# Patient Record
Sex: Male | Born: 1970 | Race: Black or African American | Hispanic: No | Marital: Single | Smoking: Current some day smoker
Health system: Southern US, Community
[De-identification: ages and names within clinical notes are randomized; demographics above are authoritative.]

## PROBLEM LIST (undated history)

## (undated) DIAGNOSIS — E119 Type 2 diabetes mellitus without complications: Secondary | ICD-10-CM

---

## 2018-06-17 ENCOUNTER — Other Ambulatory Visit: Payer: Self-pay

## 2018-06-17 ENCOUNTER — Encounter (HOSPITAL_COMMUNITY): Payer: Self-pay

## 2018-06-17 ENCOUNTER — Ambulatory Visit (HOSPITAL_COMMUNITY)
Admission: EM | Admit: 2018-06-17 | Discharge: 2018-06-17 | Disposition: A | Discharge status: Home / Self Care | Payer: Self-pay | Attending: Internal Medicine | Admitting: Internal Medicine

## 2018-06-17 DIAGNOSIS — K0889 Other specified disorders of teeth and supporting structures: Secondary | ICD-10-CM

## 2018-06-17 HISTORY — DX: Type 2 diabetes mellitus without complications: E11.9

## 2018-06-17 MED ORDER — AMOXICILLIN-POT CLAVULANATE 875-125 MG PO TABS: 1.0000 | ORAL_TABLET | Freq: Two times a day (BID) | ORAL | 0 refills | Status: AC

## 2018-06-17 MED ORDER — HYDROCODONE-ACETAMINOPHEN 5-325 MG PO TABS
1.0000 | ORAL_TABLET | Freq: Once | ORAL | Status: AC
  Administered 2018-06-17: 10:00:00 1 via ORAL

## 2018-06-17 MED ORDER — HYDROCODONE-ACETAMINOPHEN 5-325 MG PO TABS
ORAL_TABLET | ORAL | Status: AC
  Filled 2018-06-17: qty 1

## 2018-06-17 NOTE — ED Triage Notes (Signed)
Pt state he has a toothache. Pt the pain on the left side of his mouth.

## 2018-06-17 NOTE — ED Provider Notes (Signed)
[facility]    CSN: [id number] Arrival date & time: 06/17/18  [id number]     History   Chief Complaint Chief Complaint  Patient presents with  . Dental Pain    HPI Patrick Cisneros is a 48 y.o. male.   [name] with complaints of left lower dental pain which has been intermittent for years now but worse over the past week and now constant. Pain with eating. No facial swelling. Last week took some one else's left over penicillin which helped for a day. Has been taking aleve which helps only temporarily. No fevers. Doesn't have a local dentist. No known drainage. No ear pain or sore throat. No tongue or jaw pain. Hx of dm.    ROS per HPI, negative if not otherwise mentioned.      Past Medical History:  Diagnosis Date  . Diabetes mellitus without complication (HCC)     There are no active problems to display for this patient.   History reviewed. No pertinent surgical history.     Home Medications    Prior to Admission medications   Medication Sig Start Date End Date Taking? Authorizing Provider  insulin glargine (LANTUS) 100 UNIT/ML injection Inject into the skin daily.   Yes [provider]  amoxicillin-clavulanate (AUGMENTIN) 875-125 MG tablet Take 1 tablet by mouth every 12 (twelve) hours for 7 days. 06/17/18 06/24/18  Georgetta HaberBurky,  B, NP    Family History History reviewed. No pertinent family history.  Social History Social History   Tobacco Use  . Smoking status: Not on file  Substance Use Topics  . Alcohol use: Not on file  . Drug use: Not on file     Allergies   Patient has no known allergies.   Review of Systems Review of Systems   Physical Exam Triage Vital Signs ED Triage Vitals  Enc Vitals Group     BP 06/17/18 0923 (!) 144/94     Pulse Rate 06/17/18 0923 82     Resp 06/17/18 0923 16     Temp 06/17/18 0923 98.9 F (37.2 C)     Temp Source 06/17/18 0923 Oral     SpO2 06/17/18 0923 97 %     Weight  06/17/18 0924 195 lb (88.5 kg)     Height --      Head Circumference --      Peak Flow --      Pain Score 06/17/18 0924 10     Pain Loc --      Pain Edu? --      Excl. in GC? --    No data found.  Updated Vital Signs BP (!) 144/94 (BP Location: Left Arm) Comment: Reported BP to CMA Theresa Staley  Pulse 82   Temp 98.9 F (37.2 C) (Oral)   Resp 16   Wt 195 lb (88.5 kg)   SpO2 97%    Physical Exam Constitutional:      Appearance: He is well-developed.  HENT:     Mouth/Throat:     Dentition: Abnormal dentition. Dental caries present.     Comments: Tooth #19 broken off and with tenderness to jaw line underlying; multiple cares noted throughout Cardiovascular:     Rate and Rhythm: Normal rate and regular rhythm.  Pulmonary:     Effort: Pulmonary effort is normal.     Breath sounds: Normal breath sounds.  Skin:    General: Skin is warm and dry.  Neurological:     Mental  Status: He is alert and oriented to person, place, and time.      UC Treatments / Results  Labs (all labs ordered are listed, but only abnormal results are displayed) Labs Reviewed - No data to display  EKG None  Radiology No results found.  Procedures Procedures (including critical care time)  Medications Ordered in UC Medications  HYDROcodone-acetaminophen (NORCO/VICODIN) 5-325 MG per tablet 1 tablet (1 tablet Oral Given 06/17/18 1002)    Initial Impression / Assessment and Plan / UC Course  I have reviewed the triage vital signs and the nursing notes.  Pertinent labs & imaging results that were available during my care of the patient were reviewed by me and considered in my medical decision making (see chart for details).     Course of augmentin provided. Ice, nsaids/tylenol for pain. Patient was dropped off here today, norco given before departure. Encouraged to follow up with dentist for definitive treatment. Patient verbalized understanding and agreeable to plan.   Final Clinical  Impressions(s) / UC Diagnoses   Final diagnoses:  Pain, dental     Discharge Instructions     Complete course of antibiotics.  May use aleve as well as tylenol as needed for pain. Ice application as needed.  Please follow up with a dentist for definitive treatment.     ED Prescriptions    Medication Sig Dispense Auth. Provider   amoxicillin-clavulanate (AUGMENTIN) 875-125 MG tablet Take 1 tablet by mouth every 12 (twelve) hours for 7 days. 14 tablet Georgetta Haber, NP     Controlled Substance Prescriptions [place] Controlled Substance Registry consulted? Not Applicable   Georgetta Haber, NP 06/17/18 1017

## 2018-06-17 NOTE — Discharge Instructions (Signed)
Complete course of antibiotics.  May use aleve as well as tylenol as needed for pain. Ice application as needed.  Please follow up with a dentist for definitive treatment.

## 2019-06-08 ENCOUNTER — Other Ambulatory Visit: Payer: Self-pay

## 2019-06-08 ENCOUNTER — Encounter (HOSPITAL_COMMUNITY): Payer: Self-pay

## 2019-06-08 ENCOUNTER — Emergency Department (HOSPITAL_COMMUNITY)
Admission: EM | Admit: 2019-06-08 | Discharge: 2019-06-09 | Disposition: A | Discharge status: Home / Self Care | Payer: Medicare HMO | Attending: Emergency Medicine | Admitting: Emergency Medicine

## 2019-06-08 DIAGNOSIS — M79604 Pain in right leg: Secondary | ICD-10-CM | POA: Diagnosis present

## 2019-06-08 DIAGNOSIS — M159 Polyosteoarthritis, unspecified: Secondary | ICD-10-CM | POA: Diagnosis not present

## 2019-06-08 DIAGNOSIS — E119 Type 2 diabetes mellitus without complications: Secondary | ICD-10-CM | POA: Diagnosis not present

## 2019-06-08 DIAGNOSIS — Z794 Long term (current) use of insulin: Secondary | ICD-10-CM | POA: Insufficient documentation

## 2019-06-08 NOTE — ED Triage Notes (Signed)
Pt arrives to ED w/ c/o RLE pain. Pt reports 10/10 pain.

## 2019-06-08 NOTE — ED Notes (Signed)
Pt is sitting outside 

## 2019-06-09 LAB — CBG MONITORING, ED: Glucose-Capillary: 262 mg/dL — ABNORMAL HIGH (ref 70–99)

## 2019-06-09 MED ORDER — IBUPROFEN 400 MG PO TABS
600.0000 mg | ORAL_TABLET | Freq: Once | ORAL | Status: DC
  Filled 2019-06-09: qty 1

## 2019-06-09 MED ORDER — ACETAMINOPHEN 325 MG PO TABS
650.0000 mg | ORAL_TABLET | Freq: Once | ORAL | Status: AC
  Administered 2019-06-09: 650 mg via ORAL
  Filled 2019-06-09: qty 2

## 2019-06-09 NOTE — Discharge Instructions (Signed)
Thank you for allowing me to care for you today in the Emergency Department.   Call the number on page 11 of your discharge paperwork to get established with a new primary care provider that takes your insurance.  Call to schedule follow-up appointment with Dr. Aundria Rud regarding the symptoms that brought you to the emergency department today.  Take 650 mg of Tylenol once every 6 hours.  Do not take more than 4000 mg of Tylenol in a 24-hour period.  Start to stretch the muscles of your hip and knee as your pain allows.  You can also try several over-the-counter treatments such as lidocaine patches and muscle cream.  Take your home dose of Lantus when you at home.  Return to the emergency department if you have any fall or injury, if you become unable to walk, if you develop weakness or symptoms in your left leg or arms, slurred speech, persistent dizziness, or other new, concerning symptoms.

## 2019-06-09 NOTE — ED Notes (Signed)
Patient verbalizes understanding of discharge instructions. Opportunity for questioning and answers were provided. Armband removed by staff, pt discharged from ED ambulatory by self\  

## 2019-06-09 NOTE — ED Provider Notes (Signed)
[facility] EMERGENCY DEPARTMENT Provider Note   CSN: [id number] Arrival date & time: 06/08/19  2041     History Chief Complaint  Patient presents with  . Leg Pain    Patrick Cisneros is a 49 y.o. male with a history of diabetes mellitus who presents the emergency department with a chief complaint of right thigh numbness and right leg pain.  The patient reports that he works in [employer].  For the last few months, he has had intermittent numbness in the posterior right thigh and intermittent right upper leg pain.  No right lower leg numbness.  He reports that sometimes he will be at work and he will squat down and will feel like "his leg doesn't want him to stand back up" states that sometimes he will be walking and will feel like his leg may want to give out, but he has never fallen.  Symptoms are more prevalent when he is at work.  He also notices cracking and crunching in his right knee with walking.  Although he has been having symptoms for months, he noticed the pain in his right upper leg was more intense.  No new or worsening symptoms today.  He denies headache, neck pain, back pain, left leg pain, numbness, or weakness, urinary or fecal incontinence, fever, chills, recent falls or injuries, right calf pain, right hip pain, right ankle pain, no swelling or redness to the leg, or muscle cramps.  He is a diabetic.  He takes Lantus, but forgot to take his daily dose today because he was not certain what was going on with his leg.  He has not established with a primary care provider.  He has not been having polyuria or polydipsia.  Endorses cannabis use prior to coming to the ER.  No treatment for symptoms prior to arrival.  He reports that he had some type of surgery on his neck many years ago.  He was taking gabapentin for some time, but has not taken the medication recently because it made him too drowsy.  The history is provided by the patient. No language  interpreter was used.       Past Medical History:  Diagnosis Date  . Diabetes mellitus without complication (HCC)     There are no problems to display for this patient.   History reviewed. No pertinent surgical history.     No family history on file.  Social History   Tobacco Use  . Smoking status: Not on file  Substance Use Topics  . Alcohol use: Not on file  . Drug use: Not on file    Home Medications Prior to Admission medications   Medication Sig Start Date End Date Taking? Authorizing Provider  insulin glargine (LANTUS) 100 UNIT/ML injection Inject into the skin daily.    [provider]    Allergies    Patient has no known allergies.  Review of Systems   Review of Systems  Constitutional: Negative for appetite change, chills and fever.  Respiratory: Negative for shortness of breath and wheezing.   Cardiovascular: Negative for chest pain and palpitations.  Gastrointestinal: Negative for abdominal pain, diarrhea, nausea and vomiting.  Genitourinary: Negative for dysuria.  Musculoskeletal: Positive for arthralgias and myalgias. Negative for back pain, gait problem, neck pain and neck stiffness.  Skin: Negative for color change, pallor, rash and wound.  Allergic/Immunologic: Negative for immunocompromised state.  Neurological: Negative for dizziness, seizures, syncope, weakness, numbness and headaches.  Psychiatric/Behavioral: Negative for confusion.  Physical Exam Updated Vital Signs BP 127/89 (BP Location: Right Arm)   Pulse 85   Temp 98.5 F (36.9 C) (Oral)   Resp 18   SpO2 99%   Physical Exam Vitals and nursing note reviewed.  Constitutional:      Appearance: He is well-developed.     Comments: Strong odor of cannabis in the room  HENT:     Head: Normocephalic.  Eyes:     Conjunctiva/sclera: Conjunctivae normal.  Cardiovascular:     Rate and Rhythm: Normal rate and regular rhythm.     Heart sounds: No murmur.  Pulmonary:      Effort: Pulmonary effort is normal.  Abdominal:     General: There is no distension.     Palpations: Abdomen is soft.  Musculoskeletal:     Cervical back: Neck supple.     Right lower leg: No edema.     Left lower leg: No edema.     Comments: Tenderness to palpation to the lateral joint line of the right knee.  No medial joint line tenderness.  Negative anterior and posterior drawer test.  Negative valgus and varus stress test.  Full active and passive range of motion of the right knee.  Crepitus noted to the patella with range of motion.  Full active and passive range of motion of the right hip and ankle.  Right ankle is nontender.  No focal tenderness to the right hip.  No tenderness to the cervical, thoracic, or lumbar spinous processes or bilateral paraspinal muscles.  No tenderness over the bilateral SI joints.  Sensation is intact and equal to sharp and light touch throughout the bilateral lower extremities.  Good capillary refill.  Independently moves all digits of the bilateral feet.  Patient reports reproducible symptoms with hip in flexion and minimally externally rotated.  There is minimally decreased strength against resistance on the right as compared to the left in this position.  No shortening of the bilateral lower extremities.   Skin:    General: Skin is warm and dry.  Neurological:     Mental Status: He is alert.     Comments: Ambulates with a steady gait.  No ataxia.  Gait is not antalgic.  5-5 strength against resistance of the bilateral upper extremities with intact sensation.  Cranial nerves II through XII are grossly intact.  Psychiatric:        Behavior: Behavior normal.     ED Results / Procedures / Treatments   Labs (all labs ordered are listed, but only abnormal results are displayed) Labs Reviewed  CBG MONITORING, ED - Abnormal; Notable for the following components:      Result Value   Glucose-Capillary 262 (*)    All other components within normal limits     EKG None  Radiology No results found.  Procedures Procedures (including critical care time)  Medications Ordered in ED Medications  ibuprofen (ADVIL) tablet 600 mg (600 mg Oral Refused 06/09/19 0028)  acetaminophen (TYLENOL) tablet 650 mg (650 mg Oral Given 06/09/19 0026)    ED Course  I have reviewed the triage vital signs and the nursing notes.  Pertinent labs & imaging results that were available during my care of the patient were reviewed by me and considered in my medical decision making (see chart for details).    MDM Rules/Calculators/A&P                      49 year old male with a history of diabetes  mellitus presenting with chronic numbness of the right posterior thigh and intermittent right thigh pain.  Patient has not taken his home Lantus today.  Will check point-of-care CBG given complaints of numbness.  However, I strongly suspect that this is not related to electrolyte derangements or diabetes mellitus given the chronicity and intact sensation exam.  Negative straight leg raise.  No tenderness to the lumbar spinous processes or bilateral paraspinal muscles.  No tenderness over the bilateral SI joints.  Negative straight leg raise.  Lower suspicion for lumbar radiculopathy.  Symptoms are reproducible when the right hip is in flexion and minimally externally rotated when patient is performing strength against resistance.  Otherwise, no weakness.  Given his occupation, I suspect that he has osteoarthritis of the right hip and right knee.  However, since symptoms are chronic, will defer further work-up and evaluation in the ER.  We will start the patient on Tylenol and provide a referral to sports medicine.  He also needs to be reestablished with a new primary care provider.  CBG 262.  Low suspicion for HHS or DKA at this time.  Encourage the patient to take his home Lantus.  Referral for primary care given as well as an orthopedics referral for his chronic pain.  He is  hemodynamically stable and in no acute distress.  Safe for discharge to home with outpatient follow-up as indicated.  Final Clinical Impression(s) / ED Diagnoses Final diagnoses:  Osteoarthritis of multiple joints, unspecified osteoarthritis type    Rx / DC Orders ED Discharge Orders    None       McDonald, Mia A, PA-C 06/09/19 0147    Ward, Delice Bison, DO 06/09/19 5427

## 2019-11-29 ENCOUNTER — Other Ambulatory Visit: Payer: Self-pay

## 2019-11-29 ENCOUNTER — Emergency Department (HOSPITAL_COMMUNITY)
Admission: EM | Admit: 2019-11-29 | Discharge: 2019-11-29 | Disposition: A | Discharge status: Home / Self Care | Payer: Medicare Other | Attending: Emergency Medicine | Admitting: Emergency Medicine

## 2019-11-29 ENCOUNTER — Encounter (HOSPITAL_COMMUNITY): Payer: Self-pay | Admitting: Pediatrics

## 2019-11-29 DIAGNOSIS — Z5321 Procedure and treatment not carried out due to patient leaving prior to being seen by health care provider: Secondary | ICD-10-CM | POA: Insufficient documentation

## 2019-11-29 DIAGNOSIS — R739 Hyperglycemia, unspecified: Secondary | ICD-10-CM | POA: Insufficient documentation

## 2019-11-29 LAB — BASIC METABOLIC PANEL
Anion gap: 10 (ref 5–15)
BUN: 10 mg/dL (ref 6–20)
CO2: 25 mmol/L (ref 22–32)
Calcium: 9.4 mg/dL (ref 8.9–10.3)
Chloride: 99 mmol/L (ref 98–111)
Creatinine, Ser: 0.86 mg/dL (ref 0.61–1.24)
GFR, Estimated: >60 mL/min (ref >60)
Glucose, Bld: 347 mg/dL — ABNORMAL HIGH (ref 70–99)
Potassium: 4.1 mmol/L (ref 3.5–5.1)
Sodium: 134 mmol/L — ABNORMAL LOW (ref 135–145)

## 2019-11-29 LAB — CBC
HCT: 42.9 % (ref 39.0–52.0)
Hemoglobin: 14.3 g/dL (ref 13.0–17.0)
MCH: 31.7 pg (ref 26.0–34.0)
MCHC: 33.3 g/dL (ref 30.0–36.0)
MCV: 95.1 fL (ref 80.0–100.0)
Platelets: 325 10*3/uL (ref 150–400)
RBC: 4.51 MIL/uL (ref 4.22–5.81)
RDW: 13.2 % (ref 11.5–15.5)
WBC: 6.8 10*3/uL (ref 4.0–10.5)
nRBC: 0 % (ref 0.0–0.2)

## 2019-11-29 LAB — CBG MONITORING, ED: Glucose-Capillary: 325 mg/dL — ABNORMAL HIGH (ref 70–99)

## 2019-11-29 NOTE — ED Notes (Signed)
No call for vsx3 

## 2019-11-29 NOTE — ED Notes (Signed)
This patient is called multiple times for repeat vitals and registration. No answer.

## 2019-11-29 NOTE — ED Notes (Signed)
No call for VS x2 

## 2019-11-29 NOTE — ED Triage Notes (Signed)
Stated he has not checked his sugar in a few days and feels that his sugar is high.

## 2020-01-26 ENCOUNTER — Other Ambulatory Visit: Payer: Self-pay

## 2020-01-26 ENCOUNTER — Emergency Department (HOSPITAL_COMMUNITY): Payer: Medicare Other

## 2020-01-26 ENCOUNTER — Encounter (HOSPITAL_COMMUNITY): Payer: Self-pay

## 2020-01-26 DIAGNOSIS — E119 Type 2 diabetes mellitus without complications: Secondary | ICD-10-CM | POA: Insufficient documentation

## 2020-01-26 DIAGNOSIS — Z794 Long term (current) use of insulin: Secondary | ICD-10-CM | POA: Insufficient documentation

## 2020-01-26 DIAGNOSIS — S92252A Displaced fracture of navicular [scaphoid] of left foot, initial encounter for closed fracture: Secondary | ICD-10-CM | POA: Insufficient documentation

## 2020-01-26 DIAGNOSIS — S99922A Unspecified injury of left foot, initial encounter: Secondary | ICD-10-CM | POA: Diagnosis present

## 2020-01-26 DIAGNOSIS — Y92410 Unspecified street and highway as the place of occurrence of the external cause: Secondary | ICD-10-CM | POA: Insufficient documentation
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
## 2020-01-27 ENCOUNTER — Telehealth (HOSPITAL_COMMUNITY): Payer: Self-pay | Admitting: Emergency Medicine

## 2020-01-27 ENCOUNTER — Emergency Department (HOSPITAL_COMMUNITY)
Admission: EM | Admit: 2020-01-27 | Discharge: 2020-01-27 | Disposition: A | Discharge status: Home / Self Care | Payer: Medicare Other | Attending: Emergency Medicine | Admitting: Emergency Medicine

## 2020-01-27 DIAGNOSIS — S92252A Displaced fracture of navicular [scaphoid] of left foot, initial encounter for closed fracture: Secondary | ICD-10-CM

## 2020-01-27 MED ORDER — KETOROLAC TROMETHAMINE 60 MG/2ML IM SOLN
30.0000 mg | Freq: Once | INTRAMUSCULAR | Status: AC
  Administered 2020-01-27: 30 mg via INTRAMUSCULAR
  Filled 2020-01-27: qty 2

## 2020-01-27 MED ORDER — KETOROLAC TROMETHAMINE 60 MG/2ML IM SOLN: 30.0000 mg | Freq: Once | INTRAMUSCULAR | 0 refills | Status: AC

## 2020-01-27 MED ORDER — HYDROCODONE-ACETAMINOPHEN 5-325 MG PO TABS: 1.0000 | ORAL_TABLET | Freq: Four times a day (QID) | ORAL | 0 refills | Status: DC | PRN

## 2020-01-27 NOTE — Progress Notes (Signed)
Orthopedic Tech Progress Note Patient Details:  Patrick Cisneros [facility] May 28, 1970 [id number]  Ortho Devices Type of Ortho Device: Short leg splint Ortho Device/Splint Location: LLE Ortho Device/Splint Interventions: Application   Post Interventions Patient Tolerated: Well Instructions Provided: Care of device   Patrick Cisneros [name] 01/27/2020, 3:01 AM

## 2020-01-27 NOTE — Discharge Instructions (Signed)
Thank you for allowing me to care for you today in the Emergency Department.   Please call tomorrow to schedule a follow-up appointment with orthopedic surgery.  Their information is listed above.  Typically, they will like to see about 1 week after an injury.  However, since it has been almost 2 weeks, they should be able to schedule you anytime.  Use the crutches as you should not bear weight on your left foot until you have been cleared by orthopedic surgery.  Keep the splint clean and dry.  Cover it with a plastic bag if you need to bathe or shower.  Take 650 mg of Tylenol or 600 mg of ibuprofen with food every 6 hours for pain.  You can alternate between these 2 medications every 3 hours if your pain returns.  For instance, you can take Tylenol at noon, followed by a dose of ibuprofen at 3, followed by second dose of Tylenol and 6.  Elevate the leg so that your toes are at or above the level of your nose when you are sitting and resting.  Return to the emergency department if you have any fall or injury, if your toes turn blue, if you have severe swelling to the left lower leg, or other new, concerning injuries.
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
## 2020-01-27 NOTE — ED Notes (Signed)
Ortho at bedside.

## 2020-01-27 NOTE — ED Provider Notes (Signed)
Patient was seen and evaluated by my partner, Frederik Pear, PA earlier this morning for previous foot injury.  He was found to have a navicular fracture of the left foot.  Patient was discharged home with a prescription for Toradol IM injection.  [facility] [place] faxed a paper indicating that they do not have this medication available.  Since patient does have a fracture, I will prescribe opiate pain medication instead.   Fayrene Helper, PA-C 01/27/20 0945    Tegeler, Canary Brim, MD 01/27/20 [phone]

## 2020-01-27 NOTE — ED Notes (Signed)
Patient provided juice, cheese, and crackers.

## 2020-06-01 ENCOUNTER — Other Ambulatory Visit: Payer: Self-pay

## 2020-06-01 ENCOUNTER — Encounter (HOSPITAL_COMMUNITY): Payer: Self-pay | Admitting: Emergency Medicine

## 2020-06-01 ENCOUNTER — Emergency Department (HOSPITAL_COMMUNITY)
Admission: EM | Admit: 2020-06-01 | Discharge: 2020-06-01 | Disposition: A | Discharge status: Home / Self Care | Payer: Self-pay | Attending: Emergency Medicine | Admitting: Emergency Medicine

## 2020-06-01 ENCOUNTER — Emergency Department (HOSPITAL_BASED_OUTPATIENT_CLINIC_OR_DEPARTMENT_OTHER): Payer: Self-pay

## 2020-06-01 ENCOUNTER — Emergency Department (HOSPITAL_COMMUNITY): Payer: Self-pay

## 2020-06-01 DIAGNOSIS — M79605 Pain in left leg: Secondary | ICD-10-CM

## 2020-06-01 DIAGNOSIS — I82812 Embolism and thrombosis of superficial veins of left lower extremities: Secondary | ICD-10-CM | POA: Insufficient documentation

## 2020-06-01 DIAGNOSIS — Z794 Long term (current) use of insulin: Secondary | ICD-10-CM | POA: Insufficient documentation

## 2020-06-01 DIAGNOSIS — M79672 Pain in left foot: Secondary | ICD-10-CM

## 2020-06-01 DIAGNOSIS — E119 Type 2 diabetes mellitus without complications: Secondary | ICD-10-CM | POA: Insufficient documentation

## 2020-06-01 DIAGNOSIS — G8929 Other chronic pain: Secondary | ICD-10-CM

## 2020-06-01 LAB — CBG MONITORING, ED: Glucose-Capillary: 97 mg/dL (ref 70–99)

## 2020-06-01 MED ORDER — HYDROCODONE-ACETAMINOPHEN 5-325 MG PO TABS
1.0000 | ORAL_TABLET | Freq: Once | ORAL | Status: AC
  Administered 2020-06-01: 1 via ORAL
  Filled 2020-06-01: qty 1

## 2020-06-01 NOTE — ED Provider Notes (Signed)
Emergency Medicine Provider Triage Evaluation Note  Patrick Cisneros [facility] , a 50 y.o. male  was evaluated in triage.  Pt complains of left ankle and foot pain for several months. Had a foot fx several months ago and never f/u with ortho. He is also c/o calf pain. Denies chest pain or sob.  Review of Systems  Positive: Left foot/ankle pain, left calf pain Negative: Chest pain, sob  Physical Exam  BP (!) 150/83 (BP Location: Right Arm)   Pulse 85   Temp 98.3 F (36.8 C) (Oral)   Resp 17   SpO2 98%  Gen:   Awake, no distress   Resp:  Normal effort  MSK:   Moves extremities without difficulty  Other:  TTP to the left calf and diffusely to the left ankle/foot, DP pulse palpable  Medical Decision Making  Medically screening exam initiated at 12:40 PM.  Appropriate orders placed.  [name] [facility] was informed that the remainder of the evaluation will be completed by another provider, this initial triage assessment does not replace that evaluation, and the importance of remaining in the ED until their evaluation is complete.     Karrie Meres, PA-C 06/01/20 1249    Melene Plan, DO 06/01/20 1411

## 2020-06-01 NOTE — Progress Notes (Signed)
LLE venous duplex has been completed.  Preliminary findings given to Peter [employer], PA at 1537.  Results can be found under chart review under CV PROC. 06/01/2020 3:38 PM Azelea Seguin RVT, RDMS

## 2020-06-01 NOTE — ED Triage Notes (Signed)
Pt states he broke his foot in December and had his cast taken off. Since then, his left foot has been hurting all the way up his leg. Pt states he has "knots" all in the back of his calf. Denies cp/SOB

## 2020-06-01 NOTE — ED Notes (Addendum)
Pt complained that his sugar was running low. Checked pt sugar was 97. Provided pt with juice and crackers

## 2020-06-01 NOTE — Discharge Instructions (Addendum)
See the foot doctor in 1 week. Take daily 324 mg of aspiring for the next 2 weeks.This will help with the superficial blood clot. See your primary doctor for 1 week

## 2020-06-02 NOTE — Progress Notes (Addendum)
06/02/2020 134 pm TOC CM spoke to pt and gave permission to speak to girlfriend, Lindley Magnus [phone]. States he went to his pharmacy to pick up medications. And they pharmacy states they did not have any medications for him. TOC CM sent message to ED provider, Cortni C PA and states pt dc ED provider was Dr Rhunette Croft. Per PA, dc provider will have to send Rx to pharmacy and provider is currently not on schedule. Pt states he is having pain which is not relieve. And he does have insurance. Explained to pt the importance of follow up with PCP or to seek medical attention for worsening symptoms. Unable to assist with meds. Pt will follow up Urgent Care or schedule appt with a PCP. Isidoro Donning RN CCM, [facility] ED TOC CM [phone]

## 2020-06-09 NOTE — ED Provider Notes (Signed)
[facility] EMERGENCY DEPARTMENT Provider Note   CSN: [id number] Arrival date & time: 06/01/20  1157     History Chief Complaint  Patient presents with  . Foot Pain    Patrick Cisneros is a 50 y.o. male.  HPI    50 y.o w/ hx of DM, old foot fx comes in with cc of  Foot and leg pain. Has been having L foot pain for several weeks. Worse when walking and standing. Reports that he never followed up. He didn't use cam walker boot as it was painful and caused him to have pain in the achilles area.   Also reports calf/leg pain, same side. No hx of dvt-pe.  Past Medical History:  Diagnosis Date  . Diabetes mellitus without complication ([facility])     There are no problems to display for this patient.   History reviewed. No pertinent surgical history.     No family history on file.  Social History   Tobacco Use  . Smoking status: Never Smoker  . Smokeless tobacco: Never Used  Substance Use Topics  . Alcohol use: Yes  . Drug use: Not Currently    Home Medications Prior to Admission medications   Medication Sig Start Date End Date Taking? Authorizing Provider  HYDROcodone-acetaminophen (NORCO/VICODIN) 5-325 MG tablet Take 1 tablet by mouth every 6 (six) hours as needed for moderate pain or severe pain. 01/27/20   Fayrene Helper, PA-C  insulin glargine (LANTUS) 100 UNIT/ML injection Inject into the skin daily.    [provider]    Allergies    Patient has no known allergies.  Review of Systems   Review of Systems  Constitutional: Positive for activity change. Negative for chills and fever.  Respiratory: Negative for shortness of breath.   Cardiovascular: Negative for chest pain.  Musculoskeletal: Positive for arthralgias and myalgias.  Skin: Positive for rash.  Hematological: Does not bruise/bleed easily.  All other systems reviewed and are negative.   Physical Exam Updated Vital Signs BP (!) 157/101 (BP Location: Right Arm)   Pulse 71    Temp 99.2 F (37.3 C) (Oral)   Resp 16   SpO2 99%   Physical Exam Vitals and nursing note reviewed.  Constitutional:      Appearance: He is well-developed.  HENT:     Head: Atraumatic.  Cardiovascular:     Rate and Rhythm: Normal rate.  Pulmonary:     Effort: Pulmonary effort is normal.  Musculoskeletal:        General: Tenderness present. No swelling or deformity.     Cervical back: Neck supple.     Comments: Tenderness over the mid and distal foot, over the bunyan region and achilles region - all L side. + calf tenderness. Neg Thompson's  Skin:    General: Skin is warm.  Neurological:     Mental Status: He is alert and oriented to person, place, and time.     ED Results / Procedures / Treatments   Labs (all labs ordered are listed, but only abnormal results are displayed) Labs Reviewed  CBG MONITORING, ED    EKG None  Radiology No results found.  Procedures Procedures   Medications Ordered in ED Medications  HYDROcodone-acetaminophen (NORCO/VICODIN) 5-325 MG per tablet 1 tablet (1 tablet Oral Given 06/01/20 2021)    ED Course  I have reviewed the triage vital signs and the nursing notes.  Pertinent labs & imaging results that were available during my care of the patient were  reviewed by me and considered in my medical decision making (see chart for details).    MDM Rules/Calculators/A&P                          Foot and leg pain. The former is chronic, has hx of fracture to that side. Suspect pain due to poor healing - no acute issues. The leg pain - there is superficial DVT. Will advise aspirin. Pt has agreed to crutches and post op boot here. Advised foot doc follow up along with pcp  Final Clinical Impression(s) / ED Diagnoses Final diagnoses:  Acute superficial venous thrombosis of left lower extremity  Chronic foot pain, left    Rx / DC Orders ED Discharge Orders    None       Derwood Kaplan, MD 06/09/20 1528

## 2020-07-06 NOTE — Progress Notes (Deleted)
    SUBJECTIVE:   Chief compliant/HPI: annual examination and establish care.  Patrick Cisneros is a 50 y.o. who presents today to establish care. PMH: DM2 Meds: Allergies: PSH: Social: Fam hx:  History tabs reviewed and updated ***.   Review of systems form reviewed and notable for ***.   OBJECTIVE:   There were no vitals taken for this visit.  ***  ASSESSMENT/PLAN:   No problem-specific Assessment & Plan notes found for this encounter.    Annual Examination  See AVS for age appropriate recommendations.  PHQ score ***, reviewed and discussed.  Blood pressure value is *** goal, discussed.   Considered the following screening exams based upon USPSTF recommendations: Diabetes screening: {discussed/ordered:14545} Screening for elevated cholesterol: {discussed/ordered:14545} HIV testing: {discussed/ordered:14545} Hepatitis C: {discussed/ordered:14545} Hepatitis B: {discussed/ordered:14545} Syphilis if at high risk: {discussed/ordered:14545} Reviewed risk factors for latent tuberculosis and {not indicated/requested/declined:14582} Colorectal cancer screening: {crcscreen:23821::"discussed, colonoscopy ordered"} Lung cancer screening: {discussed/declined/written RSWN:46270} See documentation below regarding discussion and indication.  PSA discussed and after engaging in discussion of possible risks, benefits and complications of screening patient elected to ***.   Follow up in 1 year or sooner if indicated.    Shirlean Mylar, MD [facility]

## 2020-07-07 ENCOUNTER — Ambulatory Visit: Payer: Self-pay | Admitting: Family Medicine

## 2020-07-17 ENCOUNTER — Encounter: Payer: Self-pay | Admitting: *Deleted

## 2020-07-17 NOTE — Congregational Nurse Program (Signed)
  Dept: [phone]   Congregational Nurse Program Note  Date of Encounter: 07/17/2020  Past Medical History: Past Medical History:  Diagnosis Date   Diabetes mellitus without complication [facility])     Encounter Details:  CNP Questionnaire - 07/17/20 1222       Questionnaire   Do you give verbal consent to treat you today? Yes    Visit Setting Church or Organization    Location Patient Served At [facility]    Patient Status Homeless    Medical Provider No    Insurance Medicaid;Medicare    Intervention Assess (including screenings)   blood pressure screening   Housing/Utilities No permanent housing           Client came into [facility] for blood pressure check and register for upcoming fitness class.Client's blood pressure elevated at 159/91. Education materials provided on Heart Disease and Hypertension. Leesa Leifheit W RN CN [phone]

## 2020-07-26 ENCOUNTER — Encounter: Payer: Self-pay | Admitting: *Deleted

## 2020-07-26 NOTE — Congregational Nurse Program (Signed)
  Dept: [phone]   Congregational Nurse Program Note  Date of Encounter: 07/26/2020  Past Medical History: Past Medical History:  Diagnosis Date   Diabetes mellitus without complication [facility])     Encounter Details:  CNP Questionnaire - 07/26/20 1457       Questionnaire   Do you give verbal consent to treat you today? Yes    Visit Setting Church or Organization    Location Patient Served At [facility]    Patient Status Homeless    Medical Provider No    Insurance Medicaid;Medicare    Intervention Support            Participated in [facility] fitness class.

## 2020-08-02 ENCOUNTER — Encounter: Payer: Self-pay | Admitting: *Deleted

## 2020-08-02 NOTE — Congregational Nurse Program (Signed)
  Dept: [phone]   Congregational Nurse Program Note  Date of Encounter: 08/02/2020  Past Medical History: Past Medical History:  Diagnosis Date   Diabetes mellitus without complication [facility])     Encounter Details:  CNP Questionnaire - 08/02/20 1252       Questionnaire   Do you give verbal consent to treat you today? Yes    Visit Setting Church or Organization    Location Patient Served At [facility]    Patient Status Homeless    Medical Provider Yes    Insurance Medicare    Intervention Educate;Support    Housing/Utilities No permanent housing            Client participated in fitness class at [facility]. Education provided on heart disease and fitness.

## 2020-08-09 ENCOUNTER — Encounter: Payer: Self-pay | Admitting: *Deleted

## 2020-08-09 NOTE — Congregational Nurse Program (Signed)
  Dept: [phone]   Congregational Nurse Program Note  Date of Encounter: 08/09/2020  Past Medical History: Past Medical History:  Diagnosis Date   Diabetes mellitus without complication ([facility])     Encounter Details:  CNP Questionnaire - 08/09/20 1344       Questionnaire   Do you give verbal consent to treat you today? Yes    Visit Setting Church or Organization    Location Patient Served At [facility]    Patient Status Homeless    Insurance Medicare;Medicaid    Intervention Support    Housing/Utilities No permanent housing            Client attended fitness class at [facility].

## 2020-08-11 ENCOUNTER — Encounter: Payer: Self-pay | Admitting: *Deleted

## 2020-08-11 NOTE — Congregational Nurse Program (Signed)
  Dept: [phone]   Congregational Nurse Program Note  Date of Encounter: 08/11/2020  Past Medical History: Past Medical History:  Diagnosis Date   Diabetes mellitus without complication [facility])     Encounter Details:  CNP Questionnaire - 08/11/20 1018       Questionnaire   Do you give verbal consent to treat you today? Yes    Visit Setting Church or Organization    Location Patient Served At [facility]    Patient Status Homeless    Medical Provider No    Insurance Medicaid;Medicare    Intervention Assess (including screenings)    Housing/Utilities No permanent housing    Transportation Provided transportation assistance ([facility] transp,bus pass, taxi voucher, etc.)    Referrals Other   SS office           Client came to nurses office for transportation assistance and a blood pressure check. Client reports he plans to go to the SS office to have his information updated.  Checked vital signs. Offered support and gave two bus passes for transportation to [employer] office.

## 2020-08-16 ENCOUNTER — Encounter: Payer: Self-pay | Admitting: *Deleted

## 2020-08-16 NOTE — Congregational Nurse Program (Signed)
  Dept: [phone]   Congregational Nurse Program Note  Date of Encounter: 08/16/2020  Past Medical History: Past Medical History:  Diagnosis Date   Diabetes mellitus without complication [facility])     Encounter Details:  CNP Questionnaire - 08/16/20 1246       Questionnaire   Do you give verbal consent to treat you today? Yes    Visit Setting Church or Organization    Location Patient Served At [facility]    Patient Status Homeless    Medical Provider No    Insurance Medicaid;Medicare    Intervention Support    Housing/Utilities No permanent housing            Client attended [facility] fitness class.

## 2020-08-21 ENCOUNTER — Encounter: Payer: Self-pay | Admitting: *Deleted

## 2020-08-21 NOTE — Congregational Nurse Program (Signed)
  Dept: [phone]   Congregational Nurse Program Note  Date of Encounter: 08/21/2020  Past Medical History: Past Medical History:  Diagnosis Date   Diabetes mellitus without complication [facility])     Encounter Details:  CNP Questionnaire - 08/21/20 1354       Questionnaire   Do you give verbal consent to treat you today? Yes    Visit Setting Church or Organization    Location Patient Served At [facility]    Patient Status Homeless    Medical Provider No    Insurance Medicaid;Medicare    Intervention Support    Housing/Utilities No permanent housing             Client attended [facility] fitness class.

## 2020-09-11 ENCOUNTER — Encounter: Payer: Self-pay | Admitting: *Deleted

## 2020-09-11 NOTE — Congregational Nurse Program (Signed)
  Dept: [phone]   Congregational Nurse Program Note  Date of Encounter: 09/11/2020  Past Medical History: Past Medical History:  Diagnosis Date   Diabetes mellitus without complication [facility])     Encounter Details:  CNP Questionnaire - 09/11/20 1213       Questionnaire   Do you give verbal consent to treat you today? Yes    Visit Setting Church or Organization    Location Patient Served At [facility]    Patient Status Homeless    Medical Provider No    Insurance Medicare    Intervention Support    Housing/Utilities No permanent housing    Transportation Provided transportation assistance ([facility] transp,bus pass, taxi voucher, etc.)            Client came by nurses office to have his vitals taken. Client completed fitness program at [facility]. Offered support and encouragement.  Sheenah Dimitroff W RN [phone]

## 2020-10-11 ENCOUNTER — Encounter: Payer: Self-pay | Admitting: *Deleted

## 2020-10-11 NOTE — Congregational Nurse Program (Signed)
  Dept: [phone]   Congregational Nurse Program Note  Date of Encounter: 10/11/2020  Past Medical History: Past Medical History:  Diagnosis Date   Diabetes mellitus without complication (HCC)     Encounter Details:  CNP Questionnaire - 10/11/20 1013       Questionnaire   Do you give verbal consent to treat you today? Yes    Visit Setting Church or Organization    Location Patient Served At [facility]    Patient Status Homeless   Staying at [facility] Provider No    Insurance Medicare    Intervention Refer;Support    Housing/Utilities No permanent housing    Referrals PCP - other provider   [facility]           Client seen at [facility] and asked writer about walking as he completed prior fitness class. Walked with client and discussed need for a PCP. Checked vitals and gave application for [facility]. Client reports he is currently staying at [facility]. Glennon Kopko W RN CN [phone]

## 2020-10-16 ENCOUNTER — Encounter: Payer: Self-pay | Admitting: *Deleted

## 2020-10-16 NOTE — Congregational Nurse Program (Signed)
  Dept: [phone]   Congregational Nurse Program Note  Date of Encounter: 10/16/2020  Past Medical History: Past Medical History:  Diagnosis Date   Diabetes mellitus without complication [facility])     Encounter Details:  CNP Questionnaire - 10/16/20 1012       Questionnaire   Do you give verbal consent to treat you today? Yes    Visit Setting Church or Organization    Location Patient Served At [facility]    Patient Status Homeless   Staying at [facility] Provider No    Insurance Medicare    Intervention Support    Housing/Utilities No permanent housing    Transportation Provided transportation assistance ([facility] transp,bus pass, taxi voucher, etc.)    Referrals Other   [facility] office            Client came to nurse's office requesting help with transportation. He needed to get a replacement SS card and wanted transportation to [facility] office. Checked vitals while client was in office. Gave two bus passes for transportation. No other concerns at this time.  Erianna Jolly W RN CN [phone]

## 2020-11-17 ENCOUNTER — Encounter: Payer: Self-pay | Admitting: *Deleted

## 2020-11-17 NOTE — Congregational Nurse Program (Signed)
  Dept: [phone]   Congregational Nurse Program Note  Date of Encounter: 11/17/2020  Past Medical History: Past Medical History:  Diagnosis Date   Diabetes mellitus without complication (HCC)     Encounter Details:  CNP Questionnaire - 11/17/20 1004       Questionnaire   Do you give verbal consent to treat you today? Yes    Location Patient Served  [facility]    Visit Setting Church or Organization    Patient Status Homeless    Insurance [place];Medicare    Insurance Referral N/A    Medication N/A    Medical Provider No    Screening Referrals N/A    Medical Referral N/A   see CN note   Medical Appointment Made Other   scheduled appts for apartment tours   Food N/A    Transportation N/A    Housing/Utilities No permanent housing    Interpersonal Safety N/A    Intervention Blood pressure;Educate;Support;Counsel    ED Visit Averted N/A    Life-Saving Intervention Made N/A            Client came into nurse's office to get help finding an apartment and got his blood pressure checked. Assisted client in scheduling two tours for [place] apartments. Discussed client getting a PCP. Educated client about risk factors of HTN, diabetes and stroke. Offered to check client's blood sugar and he declines at the time. Client declines help with a PCP. He [place]. Elgin Carn W RN CN [phone]

## 2021-01-17 ENCOUNTER — Encounter (HOSPITAL_COMMUNITY): Payer: Self-pay | Admitting: *Deleted

## 2021-01-17 ENCOUNTER — Emergency Department (HOSPITAL_COMMUNITY): Payer: Medicare Other

## 2021-01-17 ENCOUNTER — Inpatient Hospital Stay (HOSPITAL_COMMUNITY)
Admission: EM | Admit: 2021-01-17 | Discharge: 2021-01-24 | DRG: 177 | Disposition: A | Discharge status: Home / Self Care | Payer: Medicare Other | Attending: Student | Admitting: Student

## 2021-01-17 DIAGNOSIS — D75839 Thrombocytosis, unspecified: Secondary | ICD-10-CM | POA: Diagnosis present

## 2021-01-17 DIAGNOSIS — Z5900 Homelessness unspecified: Secondary | ICD-10-CM

## 2021-01-17 DIAGNOSIS — Z20822 Contact with and (suspected) exposure to covid-19: Secondary | ICD-10-CM | POA: Diagnosis present

## 2021-01-17 DIAGNOSIS — E43 Unspecified severe protein-calorie malnutrition: Secondary | ICD-10-CM | POA: Diagnosis present

## 2021-01-17 DIAGNOSIS — Z681 Body mass index (BMI) 19 or less, adult: Secondary | ICD-10-CM | POA: Diagnosis not present

## 2021-01-17 DIAGNOSIS — F17290 Nicotine dependence, other tobacco product, uncomplicated: Secondary | ICD-10-CM | POA: Diagnosis present

## 2021-01-17 DIAGNOSIS — Z794 Long term (current) use of insulin: Secondary | ICD-10-CM | POA: Diagnosis not present

## 2021-01-17 DIAGNOSIS — J851 Abscess of lung with pneumonia: Secondary | ICD-10-CM | POA: Diagnosis present

## 2021-01-17 DIAGNOSIS — R1011 Right upper quadrant pain: Secondary | ICD-10-CM | POA: Diagnosis present

## 2021-01-17 DIAGNOSIS — Z8616 Personal history of COVID-19: Secondary | ICD-10-CM | POA: Diagnosis not present

## 2021-01-17 DIAGNOSIS — E1165 Type 2 diabetes mellitus with hyperglycemia: Secondary | ICD-10-CM | POA: Diagnosis present

## 2021-01-17 DIAGNOSIS — R042 Hemoptysis: Secondary | ICD-10-CM | POA: Diagnosis present

## 2021-01-17 DIAGNOSIS — K0381 Cracked tooth: Secondary | ICD-10-CM | POA: Diagnosis present

## 2021-01-17 DIAGNOSIS — Z91120 Patient's intentional underdosing of medication regimen due to financial hardship: Secondary | ICD-10-CM | POA: Diagnosis not present

## 2021-01-17 DIAGNOSIS — R739 Hyperglycemia, unspecified: Secondary | ICD-10-CM

## 2021-01-17 LAB — CBC WITH DIFFERENTIAL/PLATELET
Abs Immature Granulocytes: 0.06 10*3/uL (ref 0.00–0.07)
Basophils Absolute: 0 10*3/uL (ref 0.0–0.1)
Basophils Relative: 0 %
Eosinophils Absolute: 0 10*3/uL (ref 0.0–0.5)
Eosinophils Relative: 0 %
HCT: 40.2 % (ref 39.0–52.0)
Hemoglobin: 13.1 g/dL (ref 13.0–17.0)
Immature Granulocytes: 1 %
Lymphocytes Relative: 20 %
Lymphs Abs: 2.6 10*3/uL (ref 0.7–4.0)
MCH: 29.5 pg (ref 26.0–34.0)
MCHC: 32.6 g/dL (ref 30.0–36.0)
MCV: 90.5 fL (ref 80.0–100.0)
Monocytes Absolute: 1.2 10*3/uL — ABNORMAL HIGH (ref 0.1–1.0)
Monocytes Relative: 9 %
Neutro Abs: 9.2 10*3/uL — ABNORMAL HIGH (ref 1.7–7.7)
Neutrophils Relative %: 70 %
Platelets: 639 10*3/uL — ABNORMAL HIGH (ref 150–400)
RBC: 4.44 MIL/uL (ref 4.22–5.81)
RDW: 12.8 % (ref 11.5–15.5)
WBC: 13 10*3/uL — ABNORMAL HIGH (ref 4.0–10.5)
nRBC: 0 % (ref 0.0–0.2)

## 2021-01-17 LAB — COMPREHENSIVE METABOLIC PANEL
ALT: 22 U/L (ref 0–44)
AST: 17 U/L (ref 15–41)
Albumin: 3.1 g/dL — ABNORMAL LOW (ref 3.5–5.0)
Alkaline Phosphatase: 137 U/L — ABNORMAL HIGH (ref 38–126)
Anion gap: 7 (ref 5–15)
BUN: 11 mg/dL (ref 6–20)
CO2: 27 mmol/L (ref 22–32)
Calcium: 9.3 mg/dL (ref 8.9–10.3)
Chloride: 99 mmol/L (ref 98–111)
Creatinine, Ser: 0.68 mg/dL (ref 0.61–1.24)
GFR, Estimated: >60 mL/min (ref >60)
Glucose, Bld: 278 mg/dL — ABNORMAL HIGH (ref 70–99)
Potassium: 4.2 mmol/L (ref 3.5–5.1)
Sodium: 133 mmol/L — ABNORMAL LOW (ref 135–145)
Total Bilirubin: 0.7 mg/dL (ref 0.3–1.2)
Total Protein: 7.5 g/dL (ref 6.5–8.1)

## 2021-01-17 LAB — RESP PANEL BY RT-PCR (FLU A&B, COVID) ARPGX2
Influenza A by PCR: NEGATIVE
Influenza B by PCR: NEGATIVE
SARS Coronavirus 2 by RT PCR: NEGATIVE

## 2021-01-17 LAB — LIPASE, BLOOD: Lipase: 23 U/L (ref 11–51)

## 2021-01-17 MED ORDER — SODIUM CHLORIDE 0.9 % IV SOLN
500.0000 mg | Freq: Once | INTRAVENOUS | Status: AC
  Administered 2021-01-18: 01:00:00 500 mg via INTRAVENOUS
  Filled 2021-01-17: qty 5

## 2021-01-17 MED ORDER — SODIUM CHLORIDE 0.9 % IV SOLN
1.0000 g | Freq: Once | INTRAVENOUS | Status: AC
  Administered 2021-01-17: 23:00:00 1 g via INTRAVENOUS
  Filled 2021-01-17: qty 10

## 2021-01-17 MED ORDER — IOHEXOL 300 MG/ML  SOLN
100.0000 mL | Freq: Once | INTRAMUSCULAR | Status: AC | PRN
  Administered 2021-01-17: 22:00:00 100 mL via INTRAVENOUS

## 2021-01-17 NOTE — ED Provider Notes (Signed)
Emergency Medicine Provider Triage Evaluation Note  Norris Brumbach [facility] , a 50 y.o. male  was evaluated in triage.  Pt complains of coughing up sputum for a month with streaks of blood and it was bright red.  20 pound weight loss in the last months, loss of appetite, fatigue.  Denies fevers or chills.  History of homelessness, history of smoking.  Review of Systems  Positive: Right-sided chest pain, cough, shortness of breath, hemoptysis, weight loss Negative: Fevers, chills  Physical Exam  BP 132/85 (BP Location: Right Arm)    Pulse (!) 102    Temp 99.3 F (37.4 C) (Oral)    Resp 16    SpO2 100%  Gen:   Awake, no distress   Resp:  Normal effort  MSK:   Moves extremities without difficulty  Other:  Coarse breath sounds in the right lung upper and lower lobes worse in the right lower lobe, left lung is clear to auscultation  Medical Decision Making  Medically screening exam initiated at 1:11 PM.  Appropriate orders placed.  [name] [facility] was informed that the remainder of the evaluation will be completed by another provider, this initial triage assessment does not replace that evaluation, and the importance of remaining in the ED until their evaluation is complete.  This chart was dictated using voice recognition software, Dragon. Despite the best efforts of this provider to proofread and correct errors, errors may still occur which can change documentation meaning.    Paris Lore, PA-C 01/17/21 1312    Franne Forts, DO 01/17/21 2125

## 2021-01-17 NOTE — ED Notes (Signed)
Pt returned to room from CT

## 2021-01-17 NOTE — ED Notes (Signed)
Patient transported to CT 

## 2021-01-17 NOTE — ED Provider Notes (Signed)
[facility] EMERGENCY DEPARTMENT Provider Note   CSN: [id number] Arrival date & time: 01/17/21  1131     History Chief Complaint  Patient presents with   Cough    Patrick Cisneros is a 50 y.o. male.  Patient with history of insulin-dependent diabetes presents to the emergency department today for cough and hemoptysis.  Patient states that he was diagnosed with COVID about 3 months ago.  He felt better after couple of weeks however then worsened again with cough with hemoptysis.  Patient has felt an abnormal sensation in the right chest.  This is worse with activities.  He states that he has lost about 25 pounds in the past month.  Denies night sweats or fevers.  He has had decreased appetite but no abdominal pain or diarrhea.  Onset of symptoms acute.  Course is constant.  Patient is a smoker.  States that he is quit several times, most recently 4 days ago.  He started smoking as a young adult.  States recently he has been smoking cigars.      Past Medical History:  Diagnosis Date   Diabetes mellitus without complication (HCC)     There are no problems to display for this patient.   No past surgical history on file.     No family history on file.  Social History   Tobacco Use   Smoking status: Never   Smokeless tobacco: Never  Substance Use Topics   Alcohol use: Yes   Drug use: Not Currently    Home Medications Prior to Admission medications   Medication Sig Start Date End Date Taking? Authorizing Provider  HYDROcodone-acetaminophen (NORCO/VICODIN) 5-325 MG tablet Take 1 tablet by mouth every 6 (six) hours as needed for moderate pain or severe pain. 01/27/20   Fayrene Helper, PA-C  insulin glargine (LANTUS) 100 UNIT/ML injection Inject into the skin daily.    [provider]    Allergies    Patient has no known allergies.  Review of Systems   Review of Systems  Constitutional:  Positive for appetite change and unexpected weight change.  Negative for diaphoresis and fever.  HENT:  Negative for rhinorrhea and sore throat.   Eyes:  Negative for redness.  Respiratory:  Positive for cough and shortness of breath.   Cardiovascular:  Negative for chest pain.  Gastrointestinal:  Negative for abdominal pain, diarrhea, nausea and vomiting.  Genitourinary:  Negative for dysuria and hematuria.  Musculoskeletal:  Negative for myalgias.  Skin:  Negative for rash.  Neurological:  Negative for headaches.   Physical Exam Updated Vital Signs BP (!) 158/137    Pulse 97    Temp 99.3 F (37.4 C) (Oral)    Resp 19    SpO2 100%   Physical Exam Vitals and nursing note reviewed.  Constitutional:      General: He is not in acute distress.    Appearance: He is well-developed.  HENT:     Head: Normocephalic and atraumatic.  Eyes:     General:        Right eye: No discharge.        Left eye: No discharge.     Conjunctiva/sclera: Conjunctivae normal.  Cardiovascular:     Rate and Rhythm: Normal rate and regular rhythm.     Heart sounds: Normal heart sounds.  Pulmonary:     Effort: Pulmonary effort is normal.     Breath sounds: Rhonchi (r-sided) present.  Abdominal:     Palpations: Abdomen is  soft.     Tenderness: There is no abdominal tenderness.  Musculoskeletal:     Cervical back: Normal range of motion and neck supple.  Skin:    General: Skin is warm and dry.  Neurological:     Mental Status: He is alert.    ED Results / Procedures / Treatments   Labs (all labs ordered are listed, but only abnormal results are displayed) Labs Reviewed  COMPREHENSIVE METABOLIC PANEL - Abnormal; Notable for the following components:      Result Value   Sodium 133 (*)    Glucose, Bld 278 (*)    Albumin 3.1 (*)    Alkaline Phosphatase 137 (*)    All other components within normal limits  CBC WITH DIFFERENTIAL/PLATELET - Abnormal; Notable for the following components:   WBC 13.0 (*)    Platelets 639 (*)    Neutro Abs 9.2 (*)    Monocytes  Absolute 1.2 (*)    All other components within normal limits  RESP PANEL BY RT-PCR (FLU A&B, COVID) ARPGX2  CULTURE, BLOOD (ROUTINE X 2)  CULTURE, BLOOD (ROUTINE X 2)  LIPASE, BLOOD  LACTIC ACID, PLASMA  CBG MONITORING, ED    EKG EKG Interpretation  Date/Time:  Wednesday January 17 2021 19:26:22 EST Ventricular Rate:  83 PR Interval:  121 QRS Duration: 76 QT Interval:  378 QTC Calculation: 445 R Axis:   84 Text Interpretation: Sinus rhythm Probable left atrial enlargement Nonspecific T abnrm, anterolateral leads ST elev, probable normal early repol pattern No previous ECGs available Confirmed by Vanetta MuldersZackowski, Scott [phone]([id number]) on 01/17/2021 7:29:48 PM  Radiology DG Chest 2 View  Result Date: 01/17/2021 CLINICAL DATA:  Cough with hemoptysis EXAM: CHEST - 2 VIEW COMPARISON:  None. FINDINGS: Cardiac and mediastinal contours within normal limits. Focal right upper lobe consolidation and probable right middle lobe consolidation. No evidence of pleural effusion or pneumothorax. IMPRESSION: Focal right upper lobe consolidation and probable right middle lobe consolidation. Given chronicity of symptoms and history of hemoptysis, recommend further evaluation with contrast-enhanced CT to evaluate for neoplasm. Electronically Signed   By: Allegra LaiLeah  Strickland M.D.   On: 01/17/2021 13:35    Procedures Procedures   Medications Ordered in ED Medications - No data to display  ED Course  I have reviewed the triage vital signs and the nursing notes.  Pertinent labs & imaging results that were available during my care of the patient were reviewed by me and considered in my medical decision making (see chart for details).  Patient seen and examined. Plan discussed with patient.   Labs: Labs ordered in triage, reviewed.  White blood cell count elevated to 13,000.  Hyperglycemia noted without evidence of DKA.  Imaging: Chest x-ray with abnormal right lung infiltrate, CT chest with contrast ordered to  further evaluate  Medications/Fluids: None at this time.  Vital signs reviewed and are as follows: BP (!) 158/137    Pulse 97    Temp 99.3 F (37.4 C) (Oral)    Resp 19    SpO2 100%   Initial impression: Abnormal chest x-ray, cough and hemoptysis, concern for infection or neoplasm.  10:46 PM CT findings as above. Patient has no PCP, unstable housing. Will start on IV abx and ask hospitalist for admission.   Spoke with Dr. Rachael Darbyhotiner who will see. Requested that I get reccs from pulmonary.   11:01 PM Spoke with pulmonary.  Recs: Precautions for TB, will need 3 negative AFBs, sputum culture with cytology if able to produce.  Pulmonary will see tomorrow.    MDM Rules/Calculators/A&P                          Admit.       Final Clinical Impression(s) / ED Diagnoses Final diagnoses:  Abscess of upper lobe of right lung with pneumonia [facility])  Hyperglycemia    Rx / DC Orders ED Discharge Orders     None        Carlisle Cater, Hershal Coria 01/17/21 2303    Fredia Sorrow, MD 02/01/21 2329

## 2021-01-17 NOTE — ED Triage Notes (Signed)
To ED for eval of coughing over the past week. Describes sputum as phlegm with streaks of blood. Appears in nad. Speaking in full clear sentences.

## 2021-01-17 NOTE — H&P (Signed)
History and Physical    [facility]urn[facility] [id number] DOB: 29-Jan-1970 DOA: 01/17/2021  PCP: Pcp, No    Chief Complaint: Cough, weight loss   HPI: Patrick Cisneros is a 50 y.o. male with medical history significant for Diabetes mellitus who has not been taking insulin for past few months presented for evaluation of cough.  He reports he has been having a cough for the last few months since he was diagnosed with COVID 3 months ago.  He reports he occasionally had some phlegm production that has small speckles of blood in it but he has no frank hemoptysis or blood clots.  He reports having some right upper quadrant abdominal pain that is worse when he eats a large amount of food and activities with.  He reports he is hungry but he has to eat very small amounts at a time.  He reports he has lost 25 pounds in the last month although he has not been trying to.  He states he has intermittent mild night sweats for the last month or so.  He denies any fevers.  He denies any vomiting or diarrhea.  He denies urinary symptoms.  Cough and abdominal pain have been persistent for the last month.  Denies having any chest pain, palpitations, syncope He admits to smoking marijuana intermittently and smokes 1-2 mini cigars a day.  He has been smoking for the last 20 to 25 years.  He denies alcohol use. Reports a few weeks ago he moved in with some friends.  He is very vague on his living situation but does state he lived in a boardinghouse for homeless people a few months ago where a lot of people were sick and coughing all the time.  ED Course: Mr. Musson has been hemodynamically stable in the emergency room.  He is found to have a right upper lung lesion consistent with a lung abscess versus a malignancy.  Pulmonology's been consulted and will see patient in the morning.  Patient does have a history concerning for TB exposure so will be placed on TB precautions.  Lab work reveals WBC of 13,000 hemoglobin 13.1  hematocrit 40.2 platelets 639,000 sodium 133 potassium 4.2 chloride 99 bicarb 27 creatinine 0.68 BUN 11 glucose 278 alkaline phosphatase 137 AST 17 ALT 22 lipase 23 albumin 3.1 bilirubin 0.7.  COVID-negative.  Influenza A and B are negative. Patient was started on antibiotic coverage with Rocephin and azithromycin.  Hospitalist service been asked to admit for for further management  Review of Systems:  General: Reports unintentional weight loss, intermittent night sweats. Denies weakness, fever.  Denies dizziness.  Denies change in appetite HENT: Denies head trauma, headache, denies change in hearing, tinnitus.  Denies nasal congestion or bleeding.  Denies sore throat.  Denies difficulty swallowing Eyes: Denies blurry vision, pain in eye, drainage.  Denies discoloration of eyes. Neck: Denies pain.  Denies swelling.  Denies pain with movement. Cardiovascular: Denies chest pain, palpitations.  Denies edema.  Denies orthopnea Respiratory: Denies shortness of breath, cough.  Denies wheezing.  Denies sputum production Gastrointestinal: Denies abdominal pain, swelling.  Denies nausea, vomiting, diarrhea.  Denies melena.  Denies hematemesis. Musculoskeletal: Denies limitation of movement.  Denies deformity or swelling.  Denies pain.  Denies arthralgias or myalgias. Genitourinary: Denies pelvic pain.  Denies urinary frequency or hesitancy.  Denies dysuria.  Skin: Denies rash.  Denies petechiae, purpura, ecchymosis. Neurological: Denies syncope.  Denies seizure activity.  Denies weakness or paresthesia.  Denies slurred speech, drooping face.  Denies  visual change. Psychiatric: Denies depression, anxiety.  Denies hallucinations.  Past Medical History:  Diagnosis Date   Diabetes mellitus without complication ([place])     History reviewed. No pertinent surgical history.  Social History  reports that he has never smoked. He has never used smokeless tobacco. He reports current alcohol use. He reports that he  does not currently use drugs.  No Known Allergies  History reviewed. No pertinent family history.   Prior to Admission medications   Medication Sig Start Date End Date Taking? Authorizing Provider  HYDROcodone-acetaminophen (NORCO/VICODIN) 5-325 MG tablet Take 1 tablet by mouth every 6 (six) hours as needed for moderate pain or severe pain. 01/27/20   Domenic Moras, PA-C  insulin glargine (LANTUS) 100 UNIT/ML injection Inject into the skin daily.    [provider]    Physical Exam: Vitals:   01/17/21 2030 01/17/21 2115 01/17/21 2145 01/17/21 2230  BP: (!) 147/90 (!) 149/85 (!) 150/87 (!) 164/92  Pulse: 83 85 84 84  Resp: 14 (!) 22 10 19   Temp:      TempSrc:      SpO2: 100% 100% 100% 100%    Constitutional: NAD, calm, comfortable Vitals:   01/17/21 2030 01/17/21 2115 01/17/21 2145 01/17/21 2230  BP: (!) 147/90 (!) 149/85 (!) 150/87 (!) 164/92  Pulse: 83 85 84 84  Resp: 14 (!) 22 10 19   Temp:      TempSrc:      SpO2: 100% 100% 100% 100%   General: WD Thin male, Alert and oriented x3.  Eyes: EOMI, PERRL, conjunctivae normal.  Sclera nonicteric HENT:  [place]/AT, external ears normal.  Nares patent without epistasis.  Mucous membranes are moist. Posterior pharynx clear of any exudate Neck: Soft, normal range of motion, supple, no masses, no thyromegaly. Trachea midline Respiratory:  Right upper lung rhonchi present. no wheezing, no crackles. Normal respiratory effort. No accessory muscle use.  Cardiovascular: Regular rate and rhythm, no murmurs / rubs / gallops. No extremity edema. 2+ pedal pulses. Abdomen: Soft, RUQ and epigastric tenderness, nondistended, no rebound or guarding.  No masses palpated. No hepatosplenomegaly. Bowel sounds normoactive Musculoskeletal: FROM. Thin extremities. No cyanosis. No joint deformity upper and lower extremities. Normal muscle tone.  Skin: Warm, dry, intact no rashes, lesions, ulcers. No induration Neurologic: CN 2-12 grossly intact. Normal  speech. Sensation intact, patella DTR +1 bilaterally. Strength 5/5 in all extremities.   Psychiatric: Normal judgment and insight.  Normal mood.    Labs on Admission: I have personally reviewed following labs and imaging studies  CBC: Recent Labs  Lab 01/17/21 1316  WBC 13.0*  NEUTROABS 9.2*  HGB 13.1  HCT 40.2  MCV 90.5  PLT 639*    Basic Metabolic Panel: Recent Labs  Lab 01/17/21 1316  NA 133*  K 4.2  CL 99  CO2 27  GLUCOSE 278*  BUN 11  CREATININE 0.68  CALCIUM 9.3    GFR: CrCl cannot be calculated (Unknown ideal weight.).  Liver Function Tests: Recent Labs  Lab 01/17/21 1316  AST 17  ALT 22  ALKPHOS 137*  BILITOT 0.7  PROT 7.5  ALBUMIN 3.1*    Urine analysis: No results found for: COLORURINE, APPEARANCEUR, LABSPEC, PHURINE, GLUCOSEU, HGBUR, BILIRUBINUR, KETONESUR, PROTEINUR, UROBILINOGEN, NITRITE, LEUKOCYTESUR  Radiological Exams on Admission: DG Chest 2 View  Result Date: 01/17/2021 CLINICAL DATA:  Cough with hemoptysis EXAM: CHEST - 2 VIEW COMPARISON:  None. FINDINGS: Cardiac and mediastinal contours within normal limits. Focal right upper lobe consolidation and probable  right middle lobe consolidation. No evidence of pleural effusion or pneumothorax. IMPRESSION: Focal right upper lobe consolidation and probable right middle lobe consolidation. Given chronicity of symptoms and history of hemoptysis, recommend further evaluation with contrast-enhanced CT to evaluate for neoplasm. Electronically Signed   By: Allegra Lai M.D.   On: 01/17/2021 13:35   CT Chest W Contrast  Result Date: 01/17/2021 CLINICAL DATA:  Coughing for 1 week and episodes of hemoptysis. Abnormal chest x-ray. EXAM: CT CHEST WITH CONTRAST TECHNIQUE: Multidetector CT imaging of the chest was performed during intravenous contrast administration. CONTRAST:  OMNIPAQUE IOHEXOL 300 MG/ML  SOLN COMPARISON:  Chest x-ray, same date. FINDINGS: Cardiovascular: The heart is normal in  size. No pericardial effusion. The aorta is normal in caliber. No dissection or arthrosclerotic calcification. The branch vessels are patent. No definite coronary artery calcifications. The pulmonary arteries are grossly normal. Mediastinum/Nodes: Mildly enlarged right hilar and mediastinal lymph nodes most likely reactive/inflammatory given the right lung findings. The esophagus is grossly normal. Lungs/Pleura: As demonstrated on the chest x-ray there is a rounded density in the right upper lobe peripherally which appears necrotic with air-fluid levels. This measures a maximum 4.8 cm. There is a second nodular lesions gest posterior and inferior to this area along with several other patchy ill-defined airspace opacities and interstitial changes around it. Ill-defined coalescent nodular opacities in the right middle lobe also adjacent to the minor fissure measuring a maximum of 2.8 cm. Small patchy ground-glass type infiltrates are noted in the suspect superior segment of the right lower lobe. No pleural effusion. The left lung is clear. Think the findings are much more likely due to infection and neoplasm and recommend aggressive antibiotic treatment and follow-up chest x-ray in 3 weeks to make sure this is resolving. Upper Abdomen: No significant upper abdominal findings. Musculoskeletal: No chest wall mass, supraclavicular or axillary adenopathy. The thyroid gland is unremarkable. The bony thorax is intact. IMPRESSION: 1. 4.8 cm necrotic appearing right upper lobe pulmonary lesion, most likely a lung abscess with several other patchy ill-defined airspace opacities. Findings are much more likely due to infection than neoplasm and recommend aggressive antibiotic treatment and follow-up chest x-ray in 3 weeks to make sure this is resolving. 2. Mildly enlarged right hilar and mediastinal lymph nodes, most likely reactive/inflammatory given the right lung findings. 3. No pleural effusion and the left lung is clear.  Electronically Signed   By: Rudie Meyer M.D.   On: 01/17/2021 21:48    EKG: Independently reviewed.  EKG shows normal sinus rhythm with nonstick ST changes laterally.  Acute ST elevation or depression.  QTc 445  Assessment/Plan Principal Problem:   Pneumonia with lung abscess Mr. Soules is admitted to Med/Surg floor.  He is started on Rocephin and Azithromycin for antibiotic coverage.  Placed on TB precautions. AFB stains of sputum and sputum culture ordered.  Pulmonology consulted and will evaluate in am.  Incentive spirometer every two hours while awake Albuterol MDI as needed Robitussin DM as needed for cough Check CMP, CBC in am  Active Problems:   Uncontrolled diabetes mellitus with hyperglycemia Resume Lantus at 15 units a day and will advance as indicated. Has been out of lantus for past few months Check HgbA1c Monitor does not at bedtime and provide corrective insulin as needed for glycemic control Will need diabetes education before discharge    RUQ abdominal pain Obtain CT of the abdomen and pelvis with contrast to further evaluate with patient on arrival large abdominal pain  with unintentional weight loss    Unintended weight loss Differential diagnosis includes infectious such as TB versus malignancy.  Further work-up in progress.     Thrombocytosis  Monitor platelet. On Lovenox   DVT prophylaxis: Lovenox for DVT prophylaxis Code Status:   Full code Family Communication:  Diagnosis and plan discussed with patient.  Patient verbalized understanding and agrees with plan.  Questions answered.  Further recommendation to follow as clinical indicated Disposition Plan:   Patient is from:  Lives with a friend.  Anticipated DC to:  To be determined  Anticipated DC date:  Anticipate 2 midnight or more stay   Consults called:  Pulmonology consulted by ER PA and will see in morning Admission status:  Inpatient   Yevonne Aline Kristyana Notte MD [facility]  How to  contact the [facility] Attending or Consulting provider [place] or covering provider during after hours [place], for this patient?   Check the care team in [facility] and look for a) attending/consulting [facility] provider listed and b) the [facility] team listed Log into [email or web address] and use [place]'s universal password to access. If you do not have the password, please contact the hospital operator. Locate the [facility] provider you are looking for under [facility] and page to a number that you can be directly reached. If you still have difficulty reaching the provider, please page the [facility] (Director on Call) for the Hospitalists listed on amion for assistance.  01/17/2021, 11:38 PM

## 2021-01-18 ENCOUNTER — Other Ambulatory Visit: Payer: Self-pay

## 2021-01-18 ENCOUNTER — Inpatient Hospital Stay (HOSPITAL_COMMUNITY): Payer: Medicare Other

## 2021-01-18 DIAGNOSIS — Z794 Long term (current) use of insulin: Secondary | ICD-10-CM

## 2021-01-18 DIAGNOSIS — R634 Abnormal weight loss: Secondary | ICD-10-CM

## 2021-01-18 DIAGNOSIS — Z5900 Homelessness unspecified: Secondary | ICD-10-CM

## 2021-01-18 DIAGNOSIS — E1165 Type 2 diabetes mellitus with hyperglycemia: Secondary | ICD-10-CM

## 2021-01-18 DIAGNOSIS — R1011 Right upper quadrant pain: Secondary | ICD-10-CM

## 2021-01-18 LAB — EXPECTORATED SPUTUM ASSESSMENT W GRAM STAIN, RFLX TO RESP C

## 2021-01-18 LAB — COMPREHENSIVE METABOLIC PANEL
ALT: 17 U/L (ref 0–44)
AST: 12 U/L — ABNORMAL LOW (ref 15–41)
Albumin: 2.6 g/dL — ABNORMAL LOW (ref 3.5–5.0)
Alkaline Phosphatase: 128 U/L — ABNORMAL HIGH (ref 38–126)
Anion gap: 8 (ref 5–15)
BUN: 9 mg/dL (ref 6–20)
CO2: 26 mmol/L (ref 22–32)
Calcium: 8.8 mg/dL — ABNORMAL LOW (ref 8.9–10.3)
Chloride: 98 mmol/L (ref 98–111)
Creatinine, Ser: 0.59 mg/dL — ABNORMAL LOW (ref 0.61–1.24)
GFR, Estimated: >60 mL/min (ref >60)
Glucose, Bld: 357 mg/dL — ABNORMAL HIGH (ref 70–99)
Potassium: 3.7 mmol/L (ref 3.5–5.1)
Sodium: 132 mmol/L — ABNORMAL LOW (ref 135–145)
Total Bilirubin: 0.3 mg/dL (ref 0.3–1.2)
Total Protein: 6.5 g/dL (ref 6.5–8.1)

## 2021-01-18 LAB — HEMOGLOBIN A1C
Hgb A1c MFr Bld: 12.7 % — ABNORMAL HIGH (ref 4.8–5.6)
Mean Plasma Glucose: 317.79 mg/dL

## 2021-01-18 LAB — HIV ANTIBODY (ROUTINE TESTING W REFLEX): HIV Screen 4th Generation wRfx: NONREACTIVE

## 2021-01-18 LAB — CBC
HCT: 35.1 % — ABNORMAL LOW (ref 39.0–52.0)
Hemoglobin: 11.8 g/dL — ABNORMAL LOW (ref 13.0–17.0)
MCH: 30.3 pg (ref 26.0–34.0)
MCHC: 33.6 g/dL (ref 30.0–36.0)
MCV: 90 fL (ref 80.0–100.0)
Platelets: 589 10*3/uL — ABNORMAL HIGH (ref 150–400)
RBC: 3.9 MIL/uL — ABNORMAL LOW (ref 4.22–5.81)
RDW: 12.9 % (ref 11.5–15.5)
WBC: 12.1 10*3/uL — ABNORMAL HIGH (ref 4.0–10.5)
nRBC: 0 % (ref 0.0–0.2)

## 2021-01-18 LAB — CBG MONITORING, ED
Glucose-Capillary: 244 mg/dL — ABNORMAL HIGH (ref 70–99)
Glucose-Capillary: 166 mg/dL — ABNORMAL HIGH (ref 70–99)
Glucose-Capillary: 123 mg/dL — ABNORMAL HIGH (ref 70–99)
Glucose-Capillary: 224 mg/dL — ABNORMAL HIGH (ref 70–99)

## 2021-01-18 LAB — LACTIC ACID, PLASMA: Lactic Acid, Venous: 1 mmol/L (ref 0.5–1.9)

## 2021-01-18 MED ORDER — IOHEXOL 300 MG/ML  SOLN
70.0000 mL | Freq: Once | INTRAMUSCULAR | Status: AC | PRN
  Administered 2021-01-18: 70 mL via INTRAVENOUS

## 2021-01-18 MED ORDER — ALBUTEROL SULFATE HFA 108 (90 BASE) MCG/ACT IN AERS
2.0000 | INHALATION_SPRAY | RESPIRATORY_TRACT | Status: DC | PRN
  Filled 2021-01-18: qty 6.7

## 2021-01-18 MED ORDER — ACETAMINOPHEN 650 MG RE SUPP: 650.0000 mg | Freq: Four times a day (QID) | RECTAL | Status: DC | PRN

## 2021-01-18 MED ORDER — INSULIN GLARGINE-YFGN 100 UNIT/ML ~~LOC~~ SOLN
15.0000 [IU] | Freq: Every day | SUBCUTANEOUS | Status: DC
  Administered 2021-01-18 – 2021-01-24 (×7): 15 [IU] via SUBCUTANEOUS
  Filled 2021-01-18 (×7): qty 0.15

## 2021-01-18 MED ORDER — INSULIN ASPART 100 UNIT/ML IJ SOLN
0.0000 [IU] | Freq: Three times a day (TID) | INTRAMUSCULAR | Status: DC
  Administered 2021-01-18: 22:00:00 3 [IU] via SUBCUTANEOUS
  Administered 2021-01-18: 12:00:00 2 [IU] via SUBCUTANEOUS
  Administered 2021-01-18: 08:00:00 3 [IU] via SUBCUTANEOUS
  Administered 2021-01-19: 18:00:00 2 [IU] via SUBCUTANEOUS
  Administered 2021-01-19: 14:00:00 3 [IU] via SUBCUTANEOUS
  Administered 2021-01-19 (×2): 2 [IU] via SUBCUTANEOUS
  Administered 2021-01-20: 1 [IU] via SUBCUTANEOUS
  Administered 2021-01-20 (×2): 3 [IU] via SUBCUTANEOUS
  Administered 2021-01-20: 2 [IU] via SUBCUTANEOUS
  Administered 2021-01-21: 5 [IU] via SUBCUTANEOUS
  Administered 2021-01-21: 2 [IU] via SUBCUTANEOUS
  Administered 2021-01-21: 5 [IU] via SUBCUTANEOUS
  Administered 2021-01-21: 1 [IU] via SUBCUTANEOUS
  Administered 2021-01-22 (×2): 3 [IU] via SUBCUTANEOUS
  Administered 2021-01-22: 1 [IU] via SUBCUTANEOUS
  Administered 2021-01-22 – 2021-01-23 (×2): 2 [IU] via SUBCUTANEOUS
  Administered 2021-01-23: 5 [IU] via SUBCUTANEOUS
  Administered 2021-01-23: 3 [IU] via SUBCUTANEOUS
  Administered 2021-01-23 – 2021-01-24 (×3): 5 [IU] via SUBCUTANEOUS

## 2021-01-18 MED ORDER — ENOXAPARIN SODIUM 40 MG/0.4ML IJ SOSY
40.0000 mg | PREFILLED_SYRINGE | INTRAMUSCULAR | Status: DC
  Administered 2021-01-18 – 2021-01-24 (×6): 40 mg via SUBCUTANEOUS
  Filled 2021-01-18 (×6): qty 0.4

## 2021-01-18 MED ORDER — SODIUM CHLORIDE 0.9 % IV SOLN: 2.0000 g | INTRAVENOUS | Status: DC

## 2021-01-18 MED ORDER — SODIUM CHLORIDE 0.9 % IV SOLN
500.0000 mg | INTRAVENOUS | Status: DC
  Administered 2021-01-18: 22:00:00 500 mg via INTRAVENOUS
  Filled 2021-01-18 (×2): qty 5

## 2021-01-18 MED ORDER — GUAIFENESIN-DM 100-10 MG/5ML PO SYRP: 10.0000 mL | ORAL_SOLUTION | Freq: Four times a day (QID) | ORAL | Status: DC | PRN

## 2021-01-18 MED ORDER — LACTATED RINGERS IV SOLN: INTRAVENOUS | Status: DC

## 2021-01-18 MED ORDER — ONDANSETRON HCL 4 MG/2ML IJ SOLN: 4.0000 mg | Freq: Four times a day (QID) | INTRAMUSCULAR | Status: DC | PRN

## 2021-01-18 MED ORDER — SODIUM CHLORIDE 0.9 % IV SOLN
2.0000 g | INTRAVENOUS | Status: AC
  Administered 2021-01-18 – 2021-01-22 (×5): 2 g via INTRAVENOUS
  Filled 2021-01-18 (×5): qty 20

## 2021-01-18 MED ORDER — SODIUM CHLORIDE 0.9 % IV SOLN: 500.0000 mg | INTRAVENOUS | Status: DC

## 2021-01-18 MED ORDER — ONDANSETRON HCL 4 MG PO TABS: 4.0000 mg | ORAL_TABLET | Freq: Four times a day (QID) | ORAL | Status: DC | PRN

## 2021-01-18 MED ORDER — ACETAMINOPHEN 325 MG PO TABS
650.0000 mg | ORAL_TABLET | Freq: Four times a day (QID) | ORAL | Status: DC | PRN
  Administered 2021-01-20 (×2): 650 mg via ORAL
  Filled 2021-01-18 (×3): qty 2

## 2021-01-18 NOTE — Assessment & Plan Note (Addendum)
Completely ruling out TB although felt to be less likely.  Need 3 AFB smears to be negative, currently all 3 samples are pending.  Continue antibiotics and will need for several weeks post follow-up CT. white count normalizing.  Patient feeling somewhat better.

## 2021-01-18 NOTE — Progress Notes (Signed)
[facility] Progress Note  Patient: Patrick Cisneros    CZY:[id number]  DOA: 01/17/2021    Date of Service: the patient was seen and examined on 01/18/2021  Brief hospital course: 50 year old homeless male past medical history for poorly controlled diabetes mellitus on insulin (has been out of insulin for the past few months) who presents with cough for the last 3 months (ever since he was diagnosed with COVID back then) along with occasional hemoptysis and intermittent right upper quadrant pain as well as 25 pound weight loss in the past month, unintentional.  In the emergency room, CT scan noted upper lung lesion consistent with lung abscess versus malignancy and white blood cell count at 13 with negative COVID and flu.  Patient started on IV antibiotics and placed in TB isolation given concerns for exposure.  Pulmonary consulted.  Assessment and Plan: Respiratory * Pneumonia with lung abscess [facility]) Assessment & Plan Completely ruling out TB although felt to be less likely.  Continue antibiotics and will need for several weeks post follow-up CT.  Patient feeling better.    Endocrine Uncontrolled diabetes mellitus with hyperglycemia, with long-term current use of insulin (HCC) Assessment & Plan Has been out of his insulin for over a month.  We will set up with new medicines plus community wellness upon discharge.  Other Homeless Assessment & Plan Certainly puts patient at risk for TB.  We will also make follow-ups difficult.  Social work notified.  RUQ abdominal pain Assessment & Plan Abdominal CT unremarkable.  More likely lower lung abscess he was feeling.    There is no height or weight on file to calculate BMI.        Consultants: Pulmonary  Procedures: None  Antimicrobials: IV Rocephin and Zithromax 12/28-present  Code Status: Full code   Subjective: Patient feeling okay, occasional productive cough, minimal pain  Objective: Vital signs were reviewed and  unremarkable. Vitals:   01/18/21 1305 01/18/21 1410  BP:  136/64  Pulse: 84 84  Resp: (!) 24 13  Temp:    SpO2: 99% 100%    Intake/Output Summary (Last 24 hours) at 01/18/2021 1507 Last data filed at 01/18/2021 0214 Gross per 24 hour  Intake 350 ml  Output --  Net 350 ml   There were no vitals filed for this visit. There is no height or weight on file to calculate BMI.  Exam:  General: Alert and oriented x3, no acute distress HEENT: Normocephalic, atraumatic, mucous membranes are moist Cardiovascular: Regular rate and rhythm, S1-S2 Respiratory: Decreased breath sounds right base Abdomen: Soft, nontender, nondistended, positive bowel sounds Musculoskeletal: No clubbing or cyanosis or edema Skin: No skin breaks, tears or lesions.  Noted tattoos on arms Psychiatry: Appropriate, no evidence of psychoses Neurology: No focal deficits  Data Reviewed: My review of labs, imaging, notes and other tests shows no new significant findings.  Noted white count slowly improving.  Normal lactic acid level.  Albumin at 2.6.  A1c at 12.7  Disposition:  Status is: Inpatient  Remains inpatient appropriate because: Need to completely rule out tuberculosis   Family Communication: Left message for sister DVT Prophylaxis: enoxaparin (LOVENOX) injection 40 mg Start: 01/18/21 1000    Author: Hollice Espy ,MD 01/18/2021 3:07 PM  To reach On-call, see care teams to locate the attending and reach out via [email or web address]. Between 7PM-7AM, please contact night-coverage If you still have difficulty reaching the attending provider, please page the [facility] (Director on Call) for [facility] on amion for assistance.

## 2021-01-18 NOTE — Assessment & Plan Note (Signed)
Abdominal CT unremarkable.  More likely lower lung abscess he was feeling.

## 2021-01-18 NOTE — Hospital Course (Addendum)
50 year old homeless male past medical history for poorly controlled diabetes mellitus on insulin (has been out of insulin for the past few months) who presents with cough for the last 3 months (ever since he was diagnosed with COVID back then) along with occasional hemoptysis and intermittent right upper quadrant pain as well as 25 pound weight loss in the past month, unintentional.  In the emergency room, CT scan recommending several weeks upper lung lesion consistent with lung abscess versus malignancy and white blood cell count at 13 with negative COVID and flu.  Patient started on IV antibiotics and placed in TB isolation given concerns for exposure.  Seen by pulmonary who recommended several weeks of p.o. antibiotics.

## 2021-01-18 NOTE — Assessment & Plan Note (Addendum)
Certainly puts patient at risk for TB.  Patient states currently he is staying with a friend.

## 2021-01-18 NOTE — Assessment & Plan Note (Addendum)
Has been out of his insulin for over a month.  We will set up with new medicines plus community wellness upon discharge.  Has been having some episodes of hypoglycemia however, he tells me is because they been giving the Ensure shakes at the wrong times and then checking blood sugars.

## 2021-01-18 NOTE — Progress Notes (Signed)
Inpatient Diabetes Program Recommendations  AACE/ADA: New Consensus Statement on Inpatient Glycemic Control (2015)  Target Ranges:  Prepandial:   less than 140 mg/dL      Peak postprandial:   less than 180 mg/dL (1-2 hours)      Critically ill patients:  140 - 180 mg/dL   Lab Results  Component Value Date   GLUCAP 166 (H) 01/18/2021   HGBA1C 12.7 (H) 01/18/2021    Review of Glycemic Control  Latest Reference Range & Units 01/18/21 08:04 01/18/21 11:41  Glucose-Capillary 70 - 99 mg/dL 244 (H) 166 (H)  (H): Data is abnormally high Diabetes history: Type 2 DM Outpatient Diabetes medications: Lantus 15 units QD (NT) Current orders for Inpatient glycemic control: Semglee 15 units QD, Novolog 0-9 units TID  Inpatient Diabetes Program Recommendations:    Spoke with patient regarding outpatient diabetes management. Patient reports that he was doing well up until about 3 months ago when he got Covid and his insurance changed. Has not been taking medications. Was using insulin pens. Reviewed patient's current A1c of 12.7%. Explained what a A1c is and what it measures. Also reviewed goal A1c with patient, importance of good glucose control @ home, and blood sugar goals. Reviewed patho of DM, need for insulin, role of pancreas, impact of infection with poor glycemic control, vascular changes and commorbidities.  Patient will need a meter at discharge. Blood glucose meter kit (#[id number]). Reviewed recommended frequency of CBGs and when to call MD. Patient needs a PCP, however, will need one within walking distance. TOC consult placed. Reviewed importance of intake of CHO and mindfulness. Dietitian consult placed.  Stressed the importance of taking medications as prescribed. Patient states, "I got to get back on track." No further questions at this time.   Thanks, Bronson Curb, MSN, RNC-OB Diabetes Coordinator [phone] (8a-5p)

## 2021-01-18 NOTE — ED Notes (Signed)
Spoke with Clarita Crane, Charity fundraiser.  Has read pt chart and ready to receive once maintenance is done checking to make sure the negative pressure is working in the room. Once they are done she will place purple man

## 2021-01-18 NOTE — Consult Note (Addendum)
° °  NAME:  Patrick Cisneros, MRN:  [id number], DOB:  1970/05/22, LOS: 1 ADMISSION DATE:  01/17/2021, CONSULTATION DATE:  01/18/2021 REFERRING MD: Dr. Rito Ehrlich, CHIEF COMPLAINT: Shortness of breath, chest discomfort  History of Present Illness:  Middle-age gentleman with a history of diabetes COVID about 3 months when-was not hospitalized-was only sick for a few days Reformed cigarette smoker, was still smoking cigars up until about a week ago Alcohol use up to about 3 months ago Cough, shortness of breath, chest discomfort for about 3 to 4 weeks Occasional fevers, occasional sweats, has had some weight loss, appetite is maintained-generally not able to eat much  Pertinent  Medical History   Past Medical History:  Diagnosis Date   Diabetes mellitus without complication (HCC)    Significant Hospital Events: Including procedures, antibiotic start and stop dates in addition to other pertinent events   12/28 CT scan of the chest with multifocal pneumonia with abscess  Interim History / Subjective:  Cough, shortness of breath, blood stains phlegm  Objective   Blood pressure 134/85, pulse 83, temperature 97.8 F (36.6 C), temperature source Oral, resp. rate 19, SpO2 100 %.        Intake/Output Summary (Last 24 hours) at 01/18/2021 1056 Last data filed at 01/18/2021 0214 Gross per 24 hour  Intake 350 ml  Output --  Net 350 ml   There were no vitals filed for this visit.  Examination: General: Middle-age, does not look chronically ill HENT: Moist oral mucosa Lungs: Decreased air movement bilaterally, no wheezes, no rales Cardiovascular: S1-S2 appreciated Abdomen: Soft, bowel sounds appreciated Extremities: No clubbing, no edema Neuro: Alert and oriented x3 GU: Fair output  Resolved Hospital Problem list     Assessment & Plan:  Multilobar pneumonia -Continue antibiotics for community-acquired pneumonia -Follow cultures -Follow sputum cultures  With abscess formation will  likely need a few weeks of antibiotics -Following acute treatment -May be transitioned to Augmentin for 3 to 4 weeks to follow-up with a CT scan of the chest to follow-up on evolution of findings -No previous CT to suggest prior underlying process  Diabetes -Continue usual medications -SSI  Risk for TB does exist because of his living situation -Weight loss, occasional night sweats, sputum with streaks of blood -Doubt he has TB but important to rule it out -He was sick with COVID prior to onset of current symptoms, decreased appetite was related to being sick with COVID and he lost most of his weight around that time -Isolation with TB precautions -Needs 3 AFB negative  -He is coughing and bringing up sputum easily -Sputum for gram stain and cultures, sputum for AFB -If not coughing adequately or expectorating actively-induced sputum's may be ordered -Bronchoscopy can be considered if patient not expectorating well  Virl Diamond, MD [place] [facility] Pager: See Loretha Stapler

## 2021-01-18 NOTE — ED Notes (Signed)
Pt updated on POC. Bed has been assigned. Just waiting to clean and report to nurse

## 2021-01-18 NOTE — ED Notes (Signed)
6N called to remind them to place a purple man status since room is clean now

## 2021-01-18 NOTE — Discharge Instructions (Signed)
Begin checking blood sugars two times per day. Once in the morning when starting your day, then once following dinner If blood sguars exceed 180-200 mg/dL consistently OR < 70 mg/dL reach out to doctor Find a primary care doctor and make appointment Take medications as prescribed

## 2021-01-19 LAB — CBC
HCT: 36.9 % — ABNORMAL LOW (ref 39.0–52.0)
Hemoglobin: 12 g/dL — ABNORMAL LOW (ref 13.0–17.0)
MCH: 29.3 pg (ref 26.0–34.0)
MCHC: 32.5 g/dL (ref 30.0–36.0)
MCV: 90 fL (ref 80.0–100.0)
Platelets: 624 10*3/uL — ABNORMAL HIGH (ref 150–400)
RBC: 4.1 MIL/uL — ABNORMAL LOW (ref 4.22–5.81)
RDW: 12.6 % (ref 11.5–15.5)
WBC: 10.8 10*3/uL — ABNORMAL HIGH (ref 4.0–10.5)
nRBC: 0 % (ref 0.0–0.2)

## 2021-01-19 LAB — GLUCOSE, CAPILLARY
Glucose-Capillary: 171 mg/dL — ABNORMAL HIGH (ref 70–99)
Glucose-Capillary: 248 mg/dL — ABNORMAL HIGH (ref 70–99)
Glucose-Capillary: 158 mg/dL — ABNORMAL HIGH (ref 70–99)
Glucose-Capillary: 151 mg/dL — ABNORMAL HIGH (ref 70–99)

## 2021-01-19 MED ORDER — ENSURE ENLIVE PO LIQD
237.0000 mL | Freq: Two times a day (BID) | ORAL | Status: DC
  Administered 2021-01-20 (×2): 237 mL via ORAL

## 2021-01-19 MED ORDER — AZITHROMYCIN 250 MG PO TABS
500.0000 mg | ORAL_TABLET | Freq: Every day | ORAL | Status: AC
  Administered 2021-01-19 – 2021-01-21 (×3): 500 mg via ORAL
  Filled 2021-01-19 (×3): qty 2

## 2021-01-19 MED ORDER — PROSOURCE PLUS PO LIQD: 30.0000 mL | Freq: Two times a day (BID) | ORAL | Status: DC

## 2021-01-19 MED ORDER — GLUCERNA SHAKE PO LIQD: 237.0000 mL | Freq: Three times a day (TID) | ORAL | Status: DC

## 2021-01-19 MED ORDER — ADULT MULTIVITAMIN W/MINERALS CH
1.0000 | ORAL_TABLET | Freq: Every day | ORAL | Status: DC
  Administered 2021-01-19 – 2021-01-24 (×6): 1 via ORAL
  Filled 2021-01-19 (×6): qty 1

## 2021-01-19 NOTE — Plan of Care (Signed)
  Problem: Education: Goal: Knowledge of General Education information will improve Description: Including pain rating scale, medication(s)/side effects and non-pharmacologic comfort measures Outcome: Progressing   Problem: Activity: Goal: Risk for activity intolerance will decrease Outcome: Progressing   

## 2021-01-19 NOTE — Progress Notes (Signed)
Initial Nutrition Assessment  DOCUMENTATION CODES:   Severe malnutrition in context of chronic illness  INTERVENTION:  - Encourage PO intake  - Ensure Enlive po BID, each supplement provides 350 kcal and 20 grams of protein  - MVI with minerals daily  NUTRITION DIAGNOSIS:   Severe Malnutrition related to chronic illness (T2DM) as evidenced by energy intake < or equal to 75% for > or equal to 1 month, severe muscle depletion, severe fat depletion.  GOAL:   Patient will meet greater than or equal to 90% of their needs  MONITOR:   PO intake, Supplement acceptance, Labs, Weight trends  REASON FOR ASSESSMENT:   Consult Diet education  ASSESSMENT:   Pt admitted with cough and weight loss. PMH includes T2DM.  Pt with a R upper lung lesion consistent with a lung abscess versus malignancy. Need to rule out TB, although noted to be the less likely cause of lung abscess.   Pt reports that the food is tasteless and he is having a hard time finding foods that he enjoys. He reports that since having COVID a few months ago, his appetite has decreased and he does not eat much. He complains of early satiety so he eats smaller portions of food. He states that he has changed his diet d/t his diabetes. He can not tolerate potatoes and white bread as these make him feel "drunk." Pt usually only eats vegetable soup and does not like to eat meats too often and only drinks milk for breakfast. He states that he has not been using insulin at home. We discussed the importance of insulin use for absorption of glucose. We also discussed balanced nutrition for blood sugar control and including lean proteins as well as whole grains as opposed to refined grains as this may help with his food tolerance with the addition of fiber.  RD provided Glucerna for pt during visit however pt would like to receive Ensure during admission. Will order Ensure as this is higher in calories and protein, given pt's poor PO  intake, weight loss and malnutrition.  Pt reports a usual body weight of 190 lbs prior to having COVID. He states that is when he began losing weight however, within the past month he reports a weight of 165 lbs and endorses a 20 lbs weight loss. Unable to confirm prior weight history however, pt noted to weigh 145 lbs today. Will continue to monitor.  Medications: abx, SSI, semglee IV medications: abx, LR 58ml/hr  Labs: sodium 132, Cr 0.59, HgbA1c 12.7%, CBG's 123-248 x24 hours  I/O's: +23mL since admission  NUTRITION - FOCUSED PHYSICAL EXAM:  Flowsheet Row Most Recent Value  Orbital Region Moderate depletion  Upper Arm Region Severe depletion  Thoracic and Lumbar Region Severe depletion  Buccal Region Moderate depletion  Temple Region Moderate depletion  Clavicle Bone Region Moderate depletion  Clavicle and Acromion Bone Region Severe depletion  Scapular Bone Region Moderate depletion  Dorsal Hand Severe depletion  Patellar Region Severe depletion  Anterior Thigh Region Severe depletion  Posterior Calf Region Severe depletion  Edema (RD Assessment) None  Hair Reviewed  Eyes Reviewed  Mouth Reviewed  Skin Reviewed  Nails Reviewed       Diet Order:   Diet Order             Diet Carb Modified Fluid consistency: Thin; Room service appropriate? Yes  Diet effective now  EDUCATION NEEDS:   Education needs have been addressed  Skin:  Skin Assessment: Reviewed RN Assessment  Last BM:  01/19/21  Height:   Ht Readings from Last 1 Encounters:  01/19/21 6\' 1"  (1.854 m)    Weight:   Wt Readings from Last 1 Encounters:  01/19/21 65.9 kg    BMI:  Body mass index is 19.17 kg/m.  Estimated Nutritional Needs:   Kcal:  2100-2300  Protein:  105-115g  Fluid:  >/=2.1L  01/21/21, RDN, LDN Clinical Nutrition

## 2021-01-19 NOTE — Progress Notes (Signed)
[facility] Progress Note  Patient: Patrick Cisneros    YIR:[id number]  DOA: 01/17/2021    Date of Service: the patient was seen and examined on 01/19/2021  Brief hospital course: 50 year old homeless male past medical history for poorly controlled diabetes mellitus on insulin (has been out of insulin for the past few months) who presents with cough for the last 3 months (ever since he was diagnosed with COVID back then) along with occasional hemoptysis and intermittent right upper quadrant pain as well as 25 pound weight loss in the past month, unintentional.  In the emergency room, CT scan recommending several weeks upper lung lesion consistent with lung abscess versus malignancy and white blood cell count at 13 with negative COVID and flu.  Patient started on IV antibiotics and placed in TB isolation given concerns for exposure.  Seen by pulmonary who recommended several weeks of p.o. antibiotics.  Assessment and Plan: Respiratory * Pneumonia with lung abscess [facility]) Assessment & Plan Completely ruling out TB although felt to be less likely.  Need 3 AFB smears to be negative.  Continue antibiotics and will need for several weeks post follow-up CT. white count normalizing.  Patient feeling somewhat better.    Endocrine Uncontrolled diabetes mellitus with hyperglycemia, with long-term current use of insulin (HCC) Assessment & Plan Has been out of his insulin for over a month.  We will set up with new medicines plus community wellness upon discharge.  Other Homeless Assessment & Plan Certainly puts patient at risk for TB.  Patient states currently he is staying with a friend.  RUQ abdominal pain Assessment & Plan Abdominal CT unremarkable.  More likely lower lung abscess he was feeling.    There is no height or weight on file to calculate BMI.        Consultants: Pulmonary  Procedures: None  Antimicrobials: IV Rocephin and Zithromax 12/28-present  Code Status: Full  code   Subjective: \Feeling a little bit better, still with some cough.  Objective: Vital signs were reviewed and unremarkable. Vitals:   01/19/21 0407 01/19/21 0743  BP: 124/88 (!) 143/81  Pulse: 82 81  Resp: 17 18  Temp: 98.4 F (36.9 C) 98.4 F (36.9 C)  SpO2: 100% 98%    Intake/Output Summary (Last 24 hours) at 01/19/2021 1517 Last data filed at 01/19/2021 0410 Gross per 24 hour  Intake 2247.07 ml  Output 2300 ml  Net -52.93 ml    There were no vitals filed for this visit. There is no height or weight on file to calculate BMI.  Exam:  General: Alert and oriented x3, no acute distress HEENT: Normocephalic, atraumatic, mucous membranes are moist Cardiovascular: Regular rate and rhythm, S1-S2 Respiratory: Decreased breath sounds right base Abdomen: Soft, nontender, nondistended, positive bowel sounds Musculoskeletal: No clubbing or cyanosis or edema Skin: No skin breaks, tears or lesions.  Noted tattoos on arms Psychiatry: Appropriate, no evidence of psychoses Neurology: No focal deficits  Data Reviewed: My review of labs, imaging, notes and other tests shows no new significant findings.  White blood cell count normalized  Disposition:  Status is: Inpatient  Remains inpatient appropriate because: Need to completely rule out tuberculosis   Family Communication: Left message for sister DVT Prophylaxis: enoxaparin (LOVENOX) injection 40 mg Start: 01/18/21 1000    Author: Hollice Espy ,MD 01/19/2021 3:17 PM  To reach On-call, see care teams to locate the attending and reach out via [email or web address]. Between 7PM-7AM, please contact night-coverage If you still have difficulty reaching  the attending provider, please page the [facility] (Director on Call) for [facility] on amion for assistance.

## 2021-01-19 NOTE — Consult Note (Signed)
° °  NAME:  Patrick Cisneros, MRN:  [id number], DOB:  [birth year], LOS: 2 ADMISSION DATE:  01/17/2021, CONSULTATION DATE:  01/18/2021 REFERRING MD: Dr. Rito Ehrlich, CHIEF COMPLAINT: Shortness of breath, chest discomfort  History of Present Illness:  Middle-age gentleman with a history of diabetes COVID about 3 months when-was not hospitalized-was only sick for a few days Reformed cigarette smoker, was still smoking cigars up until about a week ago Alcohol use up to about 3 months ago Cough, shortness of breath, chest discomfort for about 3 to 4 weeks Occasional fevers, occasional sweats, has had some weight loss, appetite is maintained-generally not able to eat much  Pertinent  Medical History   Past Medical History:  Diagnosis Date   Diabetes mellitus without complication (HCC)    Significant Hospital Events: Including procedures, antibiotic start and stop dates in addition to other pertinent events   12/28 CT scan of the chest with multifocal pneumonia with abscess  Interim History / Subjective:  On RA Eating breakfast. Was able to produce sputum with bloody streaks yesterday but no sputum production today  Objective   Blood pressure (!) 143/81, pulse 81, temperature 98.4 F (36.9 C), temperature source Oral, resp. rate 18, SpO2 98 %.        Intake/Output Summary (Last 24 hours) at 01/19/2021 0102 Last data filed at 01/19/2021 0410 Gross per 24 hour  Intake 2247.07 ml  Output 2300 ml  Net -52.93 ml   There were no vitals filed for this visit.  Physical Exam: General: Well-appearing, no acute distress HENT: [place], AT, OP clear, MMM Eyes: EOMI, no scleral icterus Respiratory: Clear to auscultation bilaterally.  No crackles, wheezing or rales Cardiovascular: RRR, -M/R/G, no JVD Extremities:-Edema,-tenderness Neuro: AAO x4, CNII-XII grossly intact  Resolved Hospital Problem list     Assessment & Plan:  Multilobar pneumonia with probable lung abscess No previous CT to suggest  prior underlying process -Continue antibiotics with transition to Augmentin for total four weeks treatment -Will need follow-up CT after treatment -Follow cultures -Follow sputum cultures  Risk for TB  Homelessness, weight loss, occasional night sweats, sputum with streaks of blood -Doubt he has TB but important to rule it out -He was sick with COVID prior to onset of current symptoms, decreased appetite was related to being sick with COVID and he lost most of his weight around that time -Isolation with TB precautions -Needs 3 AFB negative -No indication for bronchoscopy as patient is bringing up sputum at this time -If not coughing adequately or expectorating actively-induced sputum's may be ordered (ordered once for today)  Pulmonary will follow-up on 1/1. If needed sooner, please call consult pager  Mechele Collin, M.D. [facility] Pulmonary/Critical Care Medicine 01/19/2021 9:28 AM   See Amion for personal pager For hours between 7 PM to 7 AM, please call Elink for urgent questions

## 2021-01-19 NOTE — TOC Initial Note (Addendum)
Transition of Care [facility]) - Initial/Assessment Note    Patient Details  Name: Patrick Cisneros MRN: [id number] Date of Birth: 1970-08-15  [facility]) CM/SW Contact:    Kingsley Plan, RN Phone Number: 01/19/2021, 12:22 PM  Clinical Narrative:                 Spoke to patient on phone.   Consult for transportation , PCP and per chart patient homeless.  NCM updated address and phone number. Patient states he can return to given address, he has transportation but needs PCP.   NCM scheduled an appointment with Gwinda Passe  for February 19, 2021 at 2:10 pm . Information placed on AVS   Expected Discharge Plan: Home/Self Care Barriers to Discharge: Continued Medical Work up   Patient Goals and CMS Choice Patient states their goals for this hospitalization and ongoing recovery are:: to return to home CMS Medicare.gov Compare Post Acute Care list provided to:: Patient    Expected Discharge Plan and Services Expected Discharge Plan: Home/Self Care   Discharge Planning Services: CM Consult, Indigent Health Clinic Post Acute Care Choice: NA Living arrangements for the past 2 months: Single Family Home                 DME Arranged: N/A         HH Arranged: NA          Prior Living Arrangements/Services Living arrangements for the past 2 months: Single Family Home Lives with:: Self Patient language and need for interpreter reviewed:: Yes Do you feel safe going back to the place where you live?: Yes            Criminal Activity/Legal Involvement Pertinent to Current Situation/Hospitalization: No - Comment as needed  Activities of Daily Living Home Assistive Devices/Equipment: CBG Meter ADL Screening (condition at time of admission) Patient's cognitive ability adequate to safely complete daily activities?: Yes Is the patient deaf or have difficulty hearing?: No Does the patient have difficulty seeing, even when wearing glasses/contacts?: No Does the  patient have difficulty concentrating, remembering, or making decisions?: No Patient able to express need for assistance with ADLs?: Yes Does the patient have difficulty dressing or bathing?: No Independently performs ADLs?: Yes (appropriate for developmental age) Communication: Independent Does the patient have difficulty walking or climbing stairs?: No Weakness of Legs: None Weakness of Arms/Hands: None  Permission Sought/Granted   Permission granted to share information with : No              Emotional Assessment   Attitude/Demeanor/Rapport: Engaged Affect (typically observed): Accepting Orientation: : Oriented to Self, Oriented to Place, Oriented to  Time, Oriented to Situation Alcohol / Substance Use: Not Applicable Psych Involvement: No (comment)  Admission diagnosis:  Pneumonia with lung abscess (HCC) [J85.1] Hyperglycemia [R73.9] Abscess of upper lobe of right lung with pneumonia (HCC) [J85.1] Patient Active Problem List   Diagnosis Date Noted   Homeless 01/18/2021   Pneumonia with lung abscess (HCC) 01/17/2021   Unintended weight loss 01/17/2021   Uncontrolled diabetes mellitus with hyperglycemia, with long-term current use of insulin (HCC) 01/17/2021   RUQ abdominal pain 01/17/2021   PCP:  Pcp, No Pharmacy:   [id number] [place],  - [address] AT [facility] & [address] [place] [address] Phone: [phone] Fax: [phone]     Social Determinants of Health (SDOH) Interventions    Readmission Risk Interventions  No flowsheet data found.

## 2021-01-20 DIAGNOSIS — E43 Unspecified severe protein-calorie malnutrition: Secondary | ICD-10-CM | POA: Diagnosis present

## 2021-01-20 LAB — GLUCOSE, CAPILLARY
Glucose-Capillary: 198 mg/dL — ABNORMAL HIGH (ref 70–99)
Glucose-Capillary: 250 mg/dL — ABNORMAL HIGH (ref 70–99)
Glucose-Capillary: 233 mg/dL — ABNORMAL HIGH (ref 70–99)
Glucose-Capillary: 132 mg/dL — ABNORMAL HIGH (ref 70–99)

## 2021-01-20 LAB — EXPECTORATED SPUTUM ASSESSMENT W GRAM STAIN, RFLX TO RESP C: Special Requests: NORMAL

## 2021-01-20 NOTE — Plan of Care (Signed)
?  Problem: Clinical Measurements: ?Goal: Ability to maintain clinical measurements within normal limits will improve ?Outcome: Progressing ?  ?Problem: Activity: ?Goal: Risk for activity intolerance will decrease ?Outcome: Progressing ?  ?Problem: Nutrition: ?Goal: Adequate nutrition will be maintained ?Outcome: Progressing ?  ?Problem: Pain Managment: ?Goal: General experience of comfort will improve ?Outcome: Progressing ?  ?

## 2021-01-20 NOTE — Progress Notes (Signed)
[facility] Progress Note  Patient: Patrick Cisneros    KDX:[id number]  DOA: 01/17/2021    Date of Service: the patient was seen and examined on 01/20/2021  Brief hospital course: 50 year old homeless male past medical history for poorly controlled diabetes mellitus on insulin (has been out of insulin for the past few months) who presents with cough for the last 3 months (ever since he was diagnosed with COVID back then) along with occasional hemoptysis and intermittent right upper quadrant pain as well as 25 pound weight loss in the past month, unintentional.  In the emergency room, CT scan recommending several weeks upper lung lesion consistent with lung abscess versus malignancy and white blood cell count at 13 with negative COVID and flu.  Patient started on IV antibiotics and placed in TB isolation given concerns for exposure.  Seen by pulmonary who recommended several weeks of p.o. antibiotics.  Assessment and Plan: Respiratory * Pneumonia with lung abscess [facility]) Assessment & Plan Completely ruling out TB although felt to be less likely.  Need 3 AFB smears to be negative.  Continue antibiotics and will need for several weeks post follow-up CT. white count normalizing.  Patient feeling somewhat better.    Endocrine Uncontrolled diabetes mellitus with hyperglycemia, with long-term current use of insulin (HCC) Assessment & Plan Has been out of his insulin for over a month.  We will set up with new medicines plus community wellness upon discharge.  Other Protein-calorie malnutrition, severe Assessment & Plan Nutrition Status: Nutrition Problem: Severe Malnutrition Etiology: chronic illness (T2DM) Signs/Symptoms: energy intake < or equal to 75% for > or equal to 1 month, severe muscle depletion, severe fat depletion Interventions: Ensure Enlive (each supplement provides 350kcal and 20 grams of protein), MVI Seen by nutrition.    Homeless Assessment & Plan Certainly puts patient  at risk for TB.  Patient states currently he is staying with a friend.  RUQ abdominal pain Assessment & Plan Abdominal CT unremarkable.  More likely lower lung abscess he was feeling.    Body mass index is 19.17 kg/m.  Nutrition Problem: Severe Malnutrition Etiology: chronic illness (T2DM)     Consultants: Pulmonary  Procedures: None  Antimicrobials: IV Rocephin and Zithromax 12/28-present  Code Status: Full code   Subjective: Minimal pain, mild productive cough.  Objective: Vital signs were reviewed and unremarkable. Vitals:   01/20/21 0459 01/20/21 0800  BP: 137/78 (!) 144/85  Pulse: 81 72  Resp: 18 17  Temp: 98.1 F (36.7 C) 98.5 F (36.9 C)  SpO2:  100%    Intake/Output Summary (Last 24 hours) at 01/20/2021 1456 Last data filed at 01/20/2021 0513 Gross per 24 hour  Intake 2002.52 ml  Output 1125 ml  Net 877.52 ml    Filed Weights   01/19/21 1600  Weight: 65.9 kg   Body mass index is 19.17 kg/m.  Exam:  General: Alert and oriented x3, no acute distress HEENT: Normocephalic, atraumatic, mucous membranes are moist Cardiovascular: Regular rate and rhythm, S1-S2 Respiratory: Decreased breath sounds right base Abdomen: Soft, nontender, nondistended, positive bowel sounds Musculoskeletal: No clubbing or cyanosis or edema Skin: No skin breaks, tears or lesions.  Noted tattoos on arms Psychiatry: Appropriate, no evidence of psychoses Neurology: No focal deficits  Data Reviewed: My review of labs, imaging, notes and other tests shows no new significant findings.  White blood cell count normalized.  AFB smears pending.  Disposition:  Status is: Inpatient  Remains inpatient appropriate because: Need to completely rule out tuberculosis  Family Communication: Left message for sister DVT Prophylaxis: enoxaparin (LOVENOX) injection 40 mg Start: 01/18/21 1000    Author: Hollice Espy ,MD 01/20/2021 2:56 PM  To reach On-call, see care  teams to locate the attending and reach out via [email or web address]. Between 7PM-7AM, please contact night-coverage If you still have difficulty reaching the attending provider, please page the [facility] (Director on Call) for [facility] on amion for assistance.

## 2021-01-20 NOTE — Assessment & Plan Note (Signed)
Nutrition Status: Nutrition Problem: Severe Malnutrition Etiology: chronic illness (T2DM) Signs/Symptoms: energy intake < or equal to 75% for > or equal to 1 month, severe muscle depletion, severe fat depletion Interventions: Ensure Enlive (each supplement provides 350kcal and 20 grams of protein), MVI Seen by nutrition.

## 2021-01-20 NOTE — Plan of Care (Signed)
  Problem: Nutrition: Goal: Adequate nutrition will be maintained Outcome: Progressing   Problem: Pain Managment: Goal: General experience of comfort will improve Outcome: Progressing   

## 2021-01-21 LAB — CBC
HCT: 36.5 % — ABNORMAL LOW (ref 39.0–52.0)
Hemoglobin: 12.4 g/dL — ABNORMAL LOW (ref 13.0–17.0)
MCH: 30.3 pg (ref 26.0–34.0)
MCHC: 34 g/dL (ref 30.0–36.0)
MCV: 89.2 fL (ref 80.0–100.0)
Platelets: 605 10*3/uL — ABNORMAL HIGH (ref 150–400)
RBC: 4.09 MIL/uL — ABNORMAL LOW (ref 4.22–5.81)
RDW: 12.7 % (ref 11.5–15.5)
WBC: 9.5 10*3/uL (ref 4.0–10.5)
nRBC: 0 % (ref 0.0–0.2)

## 2021-01-21 LAB — BASIC METABOLIC PANEL
Anion gap: 8 (ref 5–15)
BUN: 14 mg/dL (ref 6–20)
CO2: 26 mmol/L (ref 22–32)
Calcium: 8.8 mg/dL — ABNORMAL LOW (ref 8.9–10.3)
Chloride: 99 mmol/L (ref 98–111)
Creatinine, Ser: 0.68 mg/dL (ref 0.61–1.24)
GFR, Estimated: >60 mL/min (ref >60)
Glucose, Bld: 122 mg/dL — ABNORMAL HIGH (ref 70–99)
Potassium: 3.9 mmol/L (ref 3.5–5.1)
Sodium: 133 mmol/L — ABNORMAL LOW (ref 135–145)

## 2021-01-21 LAB — EXPECTORATED SPUTUM ASSESSMENT W GRAM STAIN, RFLX TO RESP C: Special Requests: NORMAL

## 2021-01-21 LAB — GLUCOSE, CAPILLARY
Glucose-Capillary: 150 mg/dL — ABNORMAL HIGH (ref 70–99)
Glucose-Capillary: 280 mg/dL — ABNORMAL HIGH (ref 70–99)
Glucose-Capillary: 176 mg/dL — ABNORMAL HIGH (ref 70–99)
Glucose-Capillary: 294 mg/dL — ABNORMAL HIGH (ref 70–99)

## 2021-01-21 MED ORDER — ENSURE ENLIVE PO LIQD
237.0000 mL | Freq: Three times a day (TID) | ORAL | Status: DC
  Administered 2021-01-21 – 2021-01-24 (×12): 237 mL via ORAL

## 2021-01-21 NOTE — Plan of Care (Signed)
  Problem: Nutrition: Goal: Adequate nutrition will be maintained Outcome: Progressing   Problem: Pain Managment: Goal: General experience of comfort will improve Outcome: Progressing   

## 2021-01-21 NOTE — Progress Notes (Signed)
[facility] Progress Note  Patient: Patrick Cisneros    [id number]  DOA: 01/17/2021    Date of Service: the patient was seen and examined on 01/21/2021  Brief hospital course: 51 year old homeless male past medical history for poorly controlled diabetes mellitus on insulin (has been out of insulin for the past few months) who presents with cough for the last 3 months (ever since he was diagnosed with COVID back then) along with occasional hemoptysis and intermittent right upper quadrant pain as well as 25 pound weight loss in the past month, unintentional.  In the emergency room, CT scan recommending several weeks upper lung lesion consistent with lung abscess versus malignancy and white blood cell count at 13 with negative COVID and flu.  Patient started on IV antibiotics and placed in TB isolation given concerns for exposure.  Seen by pulmonary who recommended several weeks of p.o. antibiotics.  Assessment and Plan: Respiratory * Pneumonia with lung abscess [facility]) Assessment & Plan Completely ruling out TB although felt to be less likely.  Need 3 AFB smears to be negative, currently all 3 samples are pending.  Continue antibiotics and will need for several weeks post follow-up CT. white count normalizing.  Patient feeling somewhat better.    Endocrine Uncontrolled diabetes mellitus with hyperglycemia, with long-term current use of insulin ([place]) Assessment & Plan Has been out of his insulin for over a month.  We will set up with new medicines plus community wellness upon discharge.  Has been having some episodes of hypoglycemia however, he tells me is because they been giving the Ensure shakes at the wrong times and then checking blood sugars.  Other Homeless Assessment & Plan Certainly puts patient at risk for TB.  Patient states currently he is staying with a friend.  RUQ abdominal pain Assessment & Plan Abdominal CT unremarkable.  More likely lower lung abscess he was  feeling.  Protein-calorie malnutrition, severe Assessment & Plan Nutrition Status: Nutrition Problem: Severe Malnutrition Etiology: chronic illness (T2DM) Signs/Symptoms: energy intake < or equal to 75% for > or equal to 1 month, severe muscle depletion, severe fat depletion Interventions: Ensure Enlive (each supplement provides 350kcal and 20 grams of protein), MVI Seen by nutrition.      Body mass index is 19.17 kg/m.  Nutrition Problem: Severe Malnutrition Etiology: chronic illness (T2DM)     Consultants: Pulmonary  Procedures: None  Antimicrobials: IV Rocephin and Zithromax 12/28-present  Code Status: Full code   Subjective: Patient continues to have improvement, minimal cough and less pain.  Objective: Vital signs were reviewed and unremarkable. Vitals:   01/21/21 1641 01/21/21 1935  BP: 136/79 140/85  Pulse: 94 85  Resp: 19 18  Temp: 98.3 F (36.8 C) 98.3 F (36.8 C)  SpO2: 100% 100%    Intake/Output Summary (Last 24 hours) at 01/21/2021 2126 Last data filed at 01/21/2021 1936 Gross per 24 hour  Intake 2016.98 ml  Output 1200 ml  Net 816.98 ml    Filed Weights   01/19/21 1600  Weight: 65.9 kg   Body mass index is 19.17 kg/m.  Exam:  General: Alert and oriented x3, no acute distress HEENT: Normocephalic, atraumatic, mucous membranes are moist Cardiovascular: Regular rate and rhythm, S1-S2 Respiratory: Decreased breath sounds right base, although much better airway exchange Abdomen: Soft, nontender, nondistended, positive bowel sounds Musculoskeletal: No clubbing or cyanosis or edema Skin: No skin breaks, tears or lesions.  Noted tattoos on arms Psychiatry: Appropriate, no evidence of psychoses Neurology: No focal deficits  Data Reviewed: My review of labs, imaging, notes and other tests shows no new significant findings.  White blood cell count normalized.  All 3 AFB smears pending.  Disposition:  Status is: Inpatient  Remains  inpatient appropriate because: Need to completely rule out tuberculosis.  3 AFB smears have been sent and samples are acceptable with results pending.  Hopefully these will be back in the next few days.   Family Communication: Left message for sister DVT Prophylaxis: enoxaparin (LOVENOX) injection 40 mg Start: 01/18/21 1000    Author: Annita Brod ,MD 01/21/2021 9:26 PM  To reach On-call, see care teams to locate the attending and reach out via [email or web address]. Between 7PM-7AM, please contact night-coverage If you still have difficulty reaching the attending provider, please page the [facility] (Director on Call) for [facility] on amion for assistance.

## 2021-01-21 NOTE — Progress Notes (Addendum)
° °  NAME:  Patrick Cisneros, MRN:  [id number], DOB:  November 02, 1970, LOS: 4 ADMISSION DATE:  01/17/2021, CONSULTATION DATE:  01/18/2021 REFERRING MD: Dr. Rito Ehrlich, CHIEF COMPLAINT: Shortness of breath, chest discomfort  History of Present Illness:  51 y/o M with a PMH of DM and COVID about 3 months PTA when-was not hospitalized-was only sick for a few days, reformed cigarette smoker, was still smoking cigars up until about a week PTA.  Alcohol use up to about 3 months ago Cough, shortness of breath, chest discomfort for about 3 to 4 weeks Occasional fevers, occasional sweats, has had some weight loss, appetite is maintained-generally not able to eat much  Pertinent  Medical History  DM  Homelessness   Significant Hospital Events: Including procedures, antibiotic start and stop dates in addition to other pertinent events   12/28 CT scan of the chest with multifocal pneumonia with abscess 12/30 On RA, able to cough sputum up, had bloody streaks.   Interim History / Subjective:  Afebrile  On RA 100% Pt asking for food, didn't sleep well last night.  Denies coughing up sputum / bloody sputum  Objective   Blood pressure 133/86, pulse 76, temperature 98 F (36.7 C), temperature source Oral, resp. rate 18, height 6\' 1"  (1.854 m), weight 65.9 kg, SpO2 100 %.        Intake/Output Summary (Last 24 hours) at 01/21/2021 0736 Last data filed at 01/21/2021 0247 Gross per 24 hour  Intake 960 ml  Output 600 ml  Net 360 ml   Filed Weights   01/19/21 1600  Weight: 65.9 kg    Physical Exam: General: thin adult male lying in bed in NAD HEENT: MM pink/moist, fair dentition, multiple cavities/dark teeth, anicteric  Neuro: AAOx4, speech clear, MAE  CV: s1s2 RRR, no m/r/g PULM: non-labored at rest on RA, lungs bilaterally clear with good air entry  GI: soft, bsx4 active  Extremities: warm/dry, no edema  Skin: no rashes or lesions  Resolved Hospital Problem list     Assessment & Plan:    Multilobar pneumonia with probable lung abscess No previous CT.  HIV negative. COVID, influenza negative. He has poor dentition and broken teeth, this may have been enough to lead to abscess.  Reports he pulled a broken tooth himself recently.  -will need abx for 4 weeks, transition to Augmentin as outpatient  -will need follow up CT chest to ensure resolution -await cultures, AFB  > note 3 have been sent -CM consulted for follow up issues  -follow intermittent CXR  -pulmonary hygiene   Risk for TB  Homelessness, weight loss, occasional night sweats, sputum with streaks of blood.  His recent illness with COVID is when his weight loss began.   -await AFB, r/o TB  -will need 3 AFB negative  -patient has been able to cough up sputum samples  -no role for bronch for now as patient has been able to produce adequate sputum    PCCM will continue to follow.    01/21/21, MSN, APRN, NP-C, AGACNP-BC [facility] 01/21/2021, 7:36 AM   Please see [email or web address] for pager details.   From 7A-7P if no response, please call [phone] After hours, please call ELink [phone]

## 2021-01-22 DIAGNOSIS — E43 Unspecified severe protein-calorie malnutrition: Secondary | ICD-10-CM

## 2021-01-22 LAB — CULTURE, BLOOD (ROUTINE X 2)
Culture: NO GROWTH
Special Requests: ADEQUATE

## 2021-01-22 LAB — ACID FAST SMEAR (AFB, MYCOBACTERIA): Acid Fast Smear: NEGATIVE

## 2021-01-22 LAB — CULTURE, RESPIRATORY W GRAM STAIN
Culture: NORMAL
Gram Stain: NONE SEEN
Special Requests: NORMAL

## 2021-01-22 LAB — GLUCOSE, CAPILLARY
Glucose-Capillary: 161 mg/dL — ABNORMAL HIGH (ref 70–99)
Glucose-Capillary: 227 mg/dL — ABNORMAL HIGH (ref 70–99)
Glucose-Capillary: 131 mg/dL — ABNORMAL HIGH (ref 70–99)
Glucose-Capillary: 258 mg/dL — ABNORMAL HIGH (ref 70–99)

## 2021-01-22 NOTE — Progress Notes (Signed)
Mobility Specialist Progress Note:   01/22/21 1115  Mobility  Activity Ambulated in room  Level of Assistance Independent  Assistive Device None  Distance Ambulated (ft) 40 ft  Mobility Ambulated independently in room  Mobility Response Tolerated well  Mobility performed by Mobility specialist  $Mobility charge 1 Mobility   Pt not requiring any physical assist. Denies SOB/cough. Pt back in bed with all needs met.   Nelta Numbers Mobility Specialist  Phone [phone]

## 2021-01-22 NOTE — Care Management Important Message (Signed)
Important Message  Patient Details  Name: Patrick Cisneros MRN: [id number] Date of Birth: 11-Apr-1970   Medicare Important Message Given:  Other (see comment)     Sherilyn Banker 01/22/2021, 2:42 PM

## 2021-01-22 NOTE — Progress Notes (Signed)
Inpatient Diabetes Program Recommendations  AACE/ADA: New Consensus Statement on Inpatient Glycemic Control (2015)  Target Ranges:  Prepandial:   less than 140 mg/dL      Peak postprandial:   less than 180 mg/dL (1-2 hours)      Critically ill patients:  140 - 180 mg/dL   Lab Results  Component Value Date   GLUCAP 227 (H) 01/22/2021   HGBA1C 12.7 (H) 01/18/2021    Review of Glycemic Control  Latest Reference Range & Units 01/21/21 08:08 01/21/21 11:44 01/21/21 16:43 01/21/21 20:53 01/22/21 08:25 01/22/21 11:56  Glucose-Capillary 70 - 99 mg/dL 150 (H) 280 (H) 176 (H) 294 (H) 161 (H) 227 (H)  (H): Data is abnormally high  Diabetes history: DM2 Outpatient Diabetes medications: Lantus 15 units QHS Current orders for Inpatient glycemic control: Semglee 15 units QHS, Novolog 0-9 units TID  Inpatient Diabetes Program Recommendations:    Novolog 2 units TID with meals IF eats at least 50%  Will continue to follow while inpatient.  Thank you, Reche Dixon, MSN, RN Diabetes Coordinator Inpatient Diabetes Program [phone] (team pager from 8a-5p)

## 2021-01-22 NOTE — Progress Notes (Signed)
PROGRESS NOTE    [facility]  ZOX:[id number] DOB: 1970/10/21 DOA: 01/17/2021 PCP: Pcp, No    Brief Narrative:  51 year old homeless male past medical history for poorly controlled diabetes mellitus on insulin (has been out of insulin for the past few months) who presents with cough for the last 3 months (ever since he was diagnosed with COVID back then) along with occasional hemoptysis and intermittent right upper quadrant pain as well as 25 pound weight loss in the past month, unintentional.  In the emergency room, CT scan recommending several weeks upper lung lesion consistent with lung abscess versus malignancy and white blood cell count at 13 with negative COVID and flu.  Patient started on IV antibiotics and placed in TB isolation given concerns for exposure.  Seen by pulmonary who recommended several weeks of p.o. antibiotics.   Assessment & Plan:   Principal Problem:   Pneumonia with lung abscess (HCC) Active Problems:   Uncontrolled diabetes mellitus with hyperglycemia, with long-term current use of insulin (HCC)   RUQ abdominal pain   Homeless   Protein-calorie malnutrition, severe  Pneumonia with pulm abscess. Condition had improved.  Will change to Augmentin at time of discharge for full course of 4 weeks. Patient has risk of TB, AFB x3 pending results.  Will discharge when results came back negative.  Uncontrolled type 2 diabetes with hyperglycemia Continue current regimen for  Severe protein calorie malnutrition  Continue Ensure.  Homelessness.   DVT prophylaxis: Lovenox Code Status: full Family Communication:  Disposition Plan:    Status is: Inpatient  Remains inpatient appropriate because: Pending AFB results.    I/O last 3 completed shifts: In: 3121.7 [P.O.:1180; I.V.:1841.7; IV Piggyback:100] Out: 1850 [Urine:1850] Total I/O In: 240 [P.O.:240] Out: -      Consultants:  Pulm  Procedures: None  Antimicrobials:  Subjective: Patient  doing well, currently asymptomatic. No fever chills  No additional abdominal pain today.  Objective: Vitals:   01/21/21 1641 01/21/21 1935 01/22/21 0457 01/22/21 0821  BP: 136/79 140/85 (!) 142/84 140/90  Pulse: 94 85 87 82  Resp: 19 18 19 18   Temp: 98.3 F (36.8 C) 98.3 F (36.8 C) 98.6 F (37 C) (!) 97.5 F (36.4 C)  TempSrc: Oral Oral Oral   SpO2: 100% 100% 100% 100%  Weight:      Height:        Intake/Output Summary (Last 24 hours) at 01/22/2021 1236 Last data filed at 01/22/2021 0930 Gross per 24 hour  Intake 2761.73 ml  Output 950 ml  Net 1811.73 ml   Filed Weights   01/19/21 1600  Weight: 65.9 kg    Examination:  General exam: Appears calm and comfortable  Respiratory system: Clear to auscultation. Respiratory effort normal. Cardiovascular system: S1 & S2 heard, RRR. No JVD, murmurs, rubs, gallops or clicks. No pedal edema. Gastrointestinal system: Abdomen is nondistended, soft and nontender. No organomegaly or masses felt. Normal bowel sounds heard. Central nervous system: Alert and oriented. No focal neurological deficits. Extremities: Symmetric 5 x 5 power. Skin: No rashes, lesions or ulcers Psychiatry: Judgement and insight appear normal. Mood & affect appropriate.     Data Reviewed: I have personally reviewed following labs and imaging studies  CBC: Recent Labs  Lab 01/17/21 1316 01/18/21 0240 01/19/21 0047 01/21/21 0042  WBC 13.0* 12.1* 10.8* 9.5  NEUTROABS 9.2*  --   --   --   HGB 13.1 11.8* 12.0* 12.4*  HCT 40.2 35.1* 36.9* 36.5*  MCV 90.5 90.0 90.0  89.2  PLT 639* 589* 624* 605*   Basic Metabolic Panel: Recent Labs  Lab 01/17/21 1316 01/18/21 0240 01/21/21 0042  NA 133* 132* 133*  K 4.2 3.7 3.9  CL 99 98 99  CO2 27 26 26   GLUCOSE 278* 357* 122*  BUN 11 9 14   CREATININE 0.68 0.59* 0.68  CALCIUM 9.3 8.8* 8.8*   GFR: Estimated Creatinine Clearance: 101.8 mL/min (by C-G formula based on SCr of 0.68 mg/dL). Liver Function  Tests: Recent Labs  Lab 01/17/21 1316 01/18/21 0240  AST 17 12*  ALT 22 17  ALKPHOS 137* 128*  BILITOT 0.7 0.3  PROT 7.5 6.5  ALBUMIN 3.1* 2.6*   Recent Labs  Lab 01/17/21 1316  LIPASE 23   No results for input(s): AMMONIA in the last 168 hours. Coagulation Profile: No results for input(s): INR, PROTIME in the last 168 hours. Cardiac Enzymes: No results for input(s): CKTOTAL, CKMB, CKMBINDEX, TROPONINI in the last 168 hours. BNP (last 3 results) No results for input(s): PROBNP in the last 8760 hours. HbA1C: No results for input(s): HGBA1C in the last 72 hours. CBG: Recent Labs  Lab 01/21/21 1144 01/21/21 1643 01/21/21 2053 01/22/21 0825 01/22/21 1156  GLUCAP 280* 176* 294* 161* 227*   Lipid Profile: No results for input(s): CHOL, HDL, LDLCALC, TRIG, CHOLHDL, LDLDIRECT in the last 72 hours. Thyroid Function Tests: No results for input(s): TSH, T4TOTAL, FREET4, T3FREE, THYROIDAB in the last 72 hours. Anemia Panel: No results for input(s): VITAMINB12, FOLATE, FERRITIN, TIBC, IRON, RETICCTPCT in the last 72 hours. Sepsis Labs: Recent Labs  Lab 01/17/21 2306  LATICACIDVEN 1.0    Recent Results (from the past 240 hour(s))  Resp Panel by RT-PCR (Flu A&B, Covid) Nasopharyngeal Swab     Status: None   Collection Time: 01/17/21  1:07 PM   Specimen: Nasopharyngeal Swab; Nasopharyngeal(NP) swabs in vial transport medium  Result Value Ref Range Status   SARS Coronavirus 2 by RT PCR NEGATIVE NEGATIVE Final    Comment: (NOTE) SARS-CoV-2 target nucleic acids are NOT DETECTED.  The SARS-CoV-2 RNA is generally detectable in upper respiratory specimens during the acute phase of infection. The lowest concentration of SARS-CoV-2 viral copies this assay can detect is 138 copies/mL. A negative result does not preclude SARS-Cov-2 infection and should not be used as the sole basis for treatment or other patient management decisions. A negative result may occur with  improper  specimen collection/handling, submission of specimen other than nasopharyngeal swab, presence of viral mutation(s) within the areas targeted by this assay, and inadequate number of viral copies(<138 copies/mL). A negative result must be combined with clinical observations, patient history, and epidemiological information. The expected result is Negative.  Fact Sheet for Patients:  2307  Fact Sheet for Healthcare Providers:  01/19/21  This test is no t yet approved or cleared by the [employer] FDA and  has been authorized for detection and/or diagnosis of SARS-CoV-2 by FDA under an Emergency Use Authorization (EUA). This EUA will remain  in effect (meaning this test can be used) for the duration of the COVID-19 declaration under Section 564(b)(1) of the Act, 21 U.S.C.section 360bbb-3(b)(1), unless the authorization is terminated  or revoked sooner.       Influenza A by PCR NEGATIVE NEGATIVE Final   Influenza B by PCR NEGATIVE NEGATIVE Final    Comment: (NOTE) The Xpert Xpress SARS-CoV-2/FLU/RSV plus assay is intended as an aid in the diagnosis of influenza from Nasopharyngeal swab specimens and should not be used  as a sole basis for treatment. Nasal washings and aspirates are unacceptable for Xpert Xpress SARS-CoV-2/FLU/RSV testing.  Fact Sheet for Patients: [employer]gerCourse.com  Fact Sheet for Healthcare Providers: SeriousBroker.it  This test is not yet approved or cleared by the [place] FDA and has been authorized for detection and/or diagnosis of SARS-CoV-2 by FDA under an Emergency Use Authorization (EUA). This EUA will remain in effect (meaning this test can be used) for the duration of the COVID-19 declaration under Section 564(b)(1) of the Act, 21 U.S.C. section 360bbb-3(b)(1), unless the authorization is terminated or revoked.  Performed at  [facility], [address], [place], [place] [address]   Blood culture (routine x 2)     Status: None   Collection Time: 01/17/21 11:01 PM   Specimen: BLOOD RIGHT FOREARM  Result Value Ref Range Status   Specimen Description BLOOD RIGHT FOREARM  Final   Special Requests   Final    BOTTLES DRAWN AEROBIC AND ANAEROBIC Blood Culture adequate volume   Culture   Final    NO GROWTH 5 DAYS Performed at [facility], [address] [place], [place] [address]    Report Status 01/22/2021 FINAL  Final  Expectorated Sputum Assessment w Gram Stain, Rflx to Resp Cult     Status: None   Collection Time: 01/18/21  2:31 PM   Specimen: Sputum  Result Value Ref Range Status   Specimen Description SPUTUM  Final   Special Requests NONE  Final   Sputum evaluation   Final    Sputum specimen not acceptable for testing.  Please recollect.   RESULT CALLED TO, READ BACK BY AND VERIFIED WITH: Lenis Dickinson RN 01/18/2021 BY JW Performed at [facility], [address] [place], [place] [address]    Report Status 01/18/2021 FINAL  Final  Expectorated Sputum Assessment w Gram Stain, Rflx to Resp Cult     Status: None   Collection Time: 01/20/21  5:33 AM   Specimen: Sputum  Result Value Ref Range Status   Specimen Description SPUTUM  Final   Special Requests Normal  Final   Sputum evaluation   Final    THIS SPECIMEN IS ACCEPTABLE FOR SPUTUM CULTURE Performed at [facility], [address] [place], [place] [address]    Report Status 01/20/2021 FINAL  Final  Culture, Respiratory w Gram Stain     Status: None   Collection Time: 01/20/21  5:33 AM   Specimen: SPU  Result Value Ref Range Status   Specimen Description SPUTUM  Final   Special Requests Normal Reflexed from [id number]  Final   Gram Stain   Final    NO SQUAMOUS EPITHELIAL CELLS SEEN FEW WBC SEEN NO ORGANISMS SEEN    Culture   Final    RARE Normal respiratory flora-no Staph aureus or Pseudomonas seen Performed at [facility], [address], [place], [place] [address]    Report Status 01/22/2021 FINAL  Final  Expectorated Sputum Assessment w Gram Stain, Rflx to Resp Cult     Status: None   Collection Time: 01/21/21  7:48 PM   Specimen: Expectorated Sputum  Result Value Ref Range Status   Specimen Description EXPECTORATED SPUTUM  Final   Special Requests Normal  Final   Sputum evaluation   Final    THIS SPECIMEN IS ACCEPTABLE FOR SPUTUM CULTURE Performed at [facility], [address], [place], [place] [address]    Report Status 01/21/2021 FINAL  Final  Culture, Respiratory w Gram Stain     Status: None (Preliminary result)   Collection Time: 01/21/21  7:48 PM  Result Value Ref Range Status   Specimen Description EXPECTORATED SPUTUM  Final   Special Requests Normal Reflexed from [id number]  Final   Gram Stain   Final    ABUNDANT WBC PRESENT, PREDOMINANTLY PMN FEW GRAM POSITIVE COCCI RARE GRAM NEGATIVE RODS    Culture   Final    TOO YOUNG TO READ Performed at [facility], [address] [place], [place] [address]    Report Status PENDING  Incomplete         Radiology Studies: No results found.      Scheduled Meds:  enoxaparin (LOVENOX) injection  40 mg Subcutaneous Q24H   feeding supplement  237 mL Oral TID AC & HS   insulin aspart  0-9 Units Subcutaneous TID WC & HS   insulin glargine-yfgn  15 Units Subcutaneous Daily   multivitamin with minerals  1 tablet Oral Daily   Continuous Infusions:  cefTRIAXone (ROCEPHIN)  IV 2 g (01/21/21 2102)   lactated ringers 75 mL/hr at 01/22/21 1159     LOS: 5 days    Time spent: 26 minutes    Marrion Coyekui Amarianna Abplanalp, MD [facility]   To contact the attending provider between 7A-7P or the covering provider during after hours 7P-7A, please log into the web site [email or web address] and access using universal [place] password for that web site. If you do not have the password, please call the hospital operator.  01/22/2021, 12:36 PM

## 2021-01-22 NOTE — Progress Notes (Signed)
01/22/21 IM Letter mailed to patient's home address due to patient's isolation status. °

## 2021-01-22 NOTE — Plan of Care (Signed)
  Problem: Nutrition: Goal: Adequate nutrition will be maintained Outcome: Progressing   Problem: Pain Managment: Goal: General experience of comfort will improve Outcome: Progressing   

## 2021-01-23 LAB — ACID FAST SMEAR (AFB, MYCOBACTERIA): Acid Fast Smear: NEGATIVE

## 2021-01-23 LAB — GLUCOSE, CAPILLARY
Glucose-Capillary: 280 mg/dL — ABNORMAL HIGH (ref 70–99)
Glucose-Capillary: 188 mg/dL — ABNORMAL HIGH (ref 70–99)
Glucose-Capillary: 266 mg/dL — ABNORMAL HIGH (ref 70–99)
Glucose-Capillary: 218 mg/dL — ABNORMAL HIGH (ref 70–99)

## 2021-01-23 MED ORDER — INSULIN ASPART 100 UNIT/ML IJ SOLN
2.0000 [IU] | Freq: Three times a day (TID) | INTRAMUSCULAR | Status: DC
  Administered 2021-01-23 – 2021-01-24 (×4): 2 [IU] via SUBCUTANEOUS

## 2021-01-23 MED ORDER — AMOXICILLIN-POT CLAVULANATE 875-125 MG PO TABS
1.0000 | ORAL_TABLET | Freq: Two times a day (BID) | ORAL | Status: DC
  Administered 2021-01-23 – 2021-01-24 (×2): 1 via ORAL
  Filled 2021-01-23 (×2): qty 1

## 2021-01-23 NOTE — Progress Notes (Signed)
PROGRESS NOTE    [facility]  ZOX:[id number] DOB: 10/31/1970 DOA: 01/17/2021 PCP: Pcp, No    Brief Narrative:  51 year old homeless male past medical history for poorly controlled diabetes mellitus on insulin (has been out of insulin for the past few months) who presents with cough for the last 3 months (ever since he was diagnosed with COVID back then) along with occasional hemoptysis and intermittent right upper quadrant pain as well as 25 pound weight loss in the past month, unintentional.  In the emergency room, CT scan recommending several weeks upper lung lesion consistent with lung abscess versus malignancy and white blood cell count at 13 with negative COVID and flu.  Patient started on IV antibiotics and placed in TB isolation given concerns for exposure.  Seen by pulmonary who recommended several weeks of p.o. antibiotics.   Assessment & Plan:   Principal Problem:   Pneumonia with lung abscess (HCC) Active Problems:   Uncontrolled diabetes mellitus with hyperglycemia, with long-term current use of insulin (HCC)   RUQ abdominal pain   Homeless   Protein-calorie malnutrition, severe  Pneumonia with pulm abscess. Patient condition has improved, will continue Augmentin for 4 weeks. Patient also has concern for TB risk, AFB was sent out.  1 came back negative.  2 sets of results still pending.  I spoke with microbiology, sample was sent out to [facility], may take a few days to come back.   Uncontrolled type 2 diabetes with hyperglycemia Continue insulin glargine, added aspart 3 times daily.  Severe protein calorie malnutrition  Continue Ensure.   Homelessness. Patient currently has a place to go after discharge.     DVT prophylaxis: Lovenox Code Status: full Family Communication:  Disposition Plan:      Status is: Inpatient   Remains inpatient appropriate because: Pending AFB results.    I/O last 3 completed shifts: In: 2761.7 [P.O.:820; I.V.:1841.7; IV  Piggyback:100] Out: 950 [Urine:950] No intake/output data recorded.     Consultants:  Pulm  Procedures: None  Antimicrobials: Augmentin   Subjective: Currently has no complaints. No short of breath or cough. No fever or chills.  Objective: Vitals:   01/22/21 0821 01/22/21 1509 01/22/21 2021 01/23/21 0636  BP: 140/90 123/78 135/62 126/73  Pulse: 82 95 84 80  Resp: 18 18 18 18   Temp: (!) 97.5 F (36.4 C) 98.1 F (36.7 C) 98.6 F (37 C) 98.2 F (36.8 C)  TempSrc:   Oral Oral  SpO2: 100% 100% 100% 100%  Weight:      Height:       No intake or output data in the 24 hours ending 01/23/21 1102 Filed Weights   01/19/21 1600  Weight: 65.9 kg    Examination:  General exam: Appears calm and comfortable, appear malnourished Respiratory system: Clear to auscultation. Respiratory effort normal. Cardiovascular system: S1 & S2 heard, RRR. No JVD, murmurs, rubs, gallops or clicks. No pedal edema. Gastrointestinal system: Abdomen is nondistended, soft and nontender. No organomegaly or masses felt. Normal bowel sounds heard. Central nervous system: Alert and oriented. No focal neurological deficits. Extremities: Symmetric 5 x 5 power. Skin: No rashes, lesions or ulcers Psychiatry: Judgement and insight appear normal. Mood & affect appropriate.     Data Reviewed: I have personally reviewed following labs and imaging studies  CBC: Recent Labs  Lab 01/17/21 1316 01/18/21 0240 01/19/21 0047 01/21/21 0042  WBC 13.0* 12.1* 10.8* 9.5  NEUTROABS 9.2*  --   --   --   HGB 13.1  11.8* 12.0* 12.4*  HCT 40.2 35.1* 36.9* 36.5*  MCV 90.5 90.0 90.0 89.2  PLT 639* 589* 624* 605*   Basic Metabolic Panel: Recent Labs  Lab 01/17/21 1316 01/18/21 0240 01/21/21 0042  NA 133* 132* 133*  K 4.2 3.7 3.9  CL 99 98 99  CO2 27 26 26   GLUCOSE 278* 357* 122*  BUN 11 9 14   CREATININE 0.68 0.59* 0.68  CALCIUM 9.3 8.8* 8.8*   GFR: Estimated Creatinine Clearance: 101.8 mL/min (by C-G  formula based on SCr of 0.68 mg/dL). Liver Function Tests: Recent Labs  Lab 01/17/21 1316 01/18/21 0240  AST 17 12*  ALT 22 17  ALKPHOS 137* 128*  BILITOT 0.7 0.3  PROT 7.5 6.5  ALBUMIN 3.1* 2.6*   Recent Labs  Lab 01/17/21 1316  LIPASE 23   No results for input(s): AMMONIA in the last 168 hours. Coagulation Profile: No results for input(s): INR, PROTIME in the last 168 hours. Cardiac Enzymes: No results for input(s): CKTOTAL, CKMB, CKMBINDEX, TROPONINI in the last 168 hours. BNP (last 3 results) No results for input(s): PROBNP in the last 8760 hours. HbA1C: No results for input(s): HGBA1C in the last 72 hours. CBG: Recent Labs  Lab 01/22/21 0825 01/22/21 1156 01/22/21 1710 01/22/21 2018 01/23/21 0824  GLUCAP 161* 227* 131* 258* 280*   Lipid Profile: No results for input(s): CHOL, HDL, LDLCALC, TRIG, CHOLHDL, LDLDIRECT in the last 72 hours. Thyroid Function Tests: No results for input(s): TSH, T4TOTAL, FREET4, T3FREE, THYROIDAB in the last 72 hours. Anemia Panel: No results for input(s): VITAMINB12, FOLATE, FERRITIN, TIBC, IRON, RETICCTPCT in the last 72 hours. Sepsis Labs: Recent Labs  Lab 01/17/21 2306  LATICACIDVEN 1.0    Recent Results (from the past 240 hour(s))  Resp Panel by RT-PCR (Flu A&B, Covid) Nasopharyngeal Swab     Status: None   Collection Time: 01/17/21  1:07 PM   Specimen: Nasopharyngeal Swab; Nasopharyngeal(NP) swabs in vial transport medium  Result Value Ref Range Status   SARS Coronavirus 2 by RT PCR NEGATIVE NEGATIVE Final    Comment: (NOTE) SARS-CoV-2 target nucleic acids are NOT DETECTED.  The SARS-CoV-2 RNA is generally detectable in upper respiratory specimens during the acute phase of infection. The lowest concentration of SARS-CoV-2 viral copies this assay can detect is 138 copies/mL. A negative result does not preclude SARS-Cov-2 infection and should not be used as the sole basis for treatment or other patient management  decisions. A negative result may occur with  improper specimen collection/handling, submission of specimen other than nasopharyngeal swab, presence of viral mutation(s) within the areas targeted by this assay, and inadequate number of viral copies(<138 copies/mL). A negative result must be combined with clinical observations, patient history, and epidemiological information. The expected result is Negative.  Fact Sheet for Patients:  2307  Fact Sheet for Healthcare Providers:  01/19/21  This test is no t yet approved or cleared by the [employer] FDA and  has been authorized for detection and/or diagnosis of SARS-CoV-2 by FDA under an Emergency Use Authorization (EUA). This EUA will remain  in effect (meaning this test can be used) for the duration of the COVID-19 declaration under Section 564(b)(1) of the Act, 21 U.S.C.section 360bbb-3(b)(1), unless the authorization is terminated  or revoked sooner.       Influenza A by PCR NEGATIVE NEGATIVE Final   Influenza B by PCR NEGATIVE NEGATIVE Final    Comment: (NOTE) The Xpert Xpress SARS-CoV-2/FLU/RSV plus assay is intended as an aid  in the diagnosis of influenza from Nasopharyngeal swab specimens and should not be used as a sole basis for treatment. Nasal washings and aspirates are unacceptable for Xpert Xpress SARS-CoV-2/FLU/RSV testing.  Fact Sheet for Patients: [employer]gerCourse.com  Fact Sheet for Healthcare Providers: SeriousBroker.it  This test is not yet approved or cleared by the [place] FDA and has been authorized for detection and/or diagnosis of SARS-CoV-2 by FDA under an Emergency Use Authorization (EUA). This EUA will remain in effect (meaning this test can be used) for the duration of the COVID-19 declaration under Section 564(b)(1) of the Act, 21 U.S.C. section 360bbb-3(b)(1), unless the  authorization is terminated or revoked.  Performed at [facility], [address] [place], [place] [address]   Blood culture (routine x 2)     Status: None   Collection Time: 01/17/21 11:01 PM   Specimen: BLOOD RIGHT FOREARM  Result Value Ref Range Status   Specimen Description BLOOD RIGHT FOREARM  Final   Special Requests   Final    BOTTLES DRAWN AEROBIC AND ANAEROBIC Blood Culture adequate volume   Culture   Final    NO GROWTH 5 DAYS Performed at [facility], [address], [place], [place] [address]    Report Status 01/22/2021 FINAL  Final  Expectorated Sputum Assessment w Gram Stain, Rflx to Resp Cult     Status: None   Collection Time: 01/18/21  2:31 PM   Specimen: Sputum  Result Value Ref Range Status   Specimen Description SPUTUM  Final   Special Requests NONE  Final   Sputum evaluation   Final    Sputum specimen not acceptable for testing.  Please recollect.   RESULT CALLED TO, READ BACK BY AND VERIFIED WITH: Lenis Dickinson RN 01/18/2021 BY JW Performed at [facility], [address], [place], [place] [address]    Report Status 01/18/2021 FINAL  Final  Acid Fast Smear (AFB)     Status: None   Collection Time: 01/19/21 10:04 AM   Specimen: Sputum  Result Value Ref Range Status   AFB Specimen Processing Concentration  Final   Acid Fast Smear Negative  Final    Comment: (NOTE) Performed At: [facility] [address] [place], [place] [id number] Jolene Schimke MD FW:[id number]    Source (AFB) SPUTUM  Final    Comment: Performed at [facility], [address] [place], [place] [address]  Expectorated Sputum Assessment w Gram Stain, Rflx to Resp Cult     Status: None   Collection Time: 01/20/21  5:33 AM   Specimen: Sputum  Result Value Ref Range Status   Specimen Description SPUTUM  Final   Special Requests Normal  Final   Sputum evaluation   Final    THIS SPECIMEN IS ACCEPTABLE FOR SPUTUM CULTURE Performed at [facility]  Lab, [address] [place], [place] [address]    Report Status 01/20/2021 FINAL  Final  Culture, Respiratory w Gram Stain     Status: None   Collection Time: 01/20/21  5:33 AM   Specimen: SPU  Result Value Ref Range Status   Specimen Description SPUTUM  Final   Special Requests Normal Reflexed from [id number]  Final   Gram Stain   Final    NO SQUAMOUS EPITHELIAL CELLS SEEN FEW WBC SEEN NO ORGANISMS SEEN    Culture   Final    RARE Normal respiratory flora-no Staph aureus or Pseudomonas seen Performed at [facility], [address] [place], [place] [address]    Report Status 01/22/2021 FINAL  Final  Expectorated Sputum Assessment w Gram Stain, Rflx to Resp Cult     Status: None   Collection Time: 01/21/21  7:48 PM   Specimen: Expectorated Sputum  Result Value Ref Range Status   Specimen Description EXPECTORATED SPUTUM  Final   Special Requests Normal  Final   Sputum evaluation   Final    THIS SPECIMEN IS ACCEPTABLE FOR SPUTUM CULTURE Performed at [facility], [address] [place], [place] [address]    Report Status 01/21/2021 FINAL  Final  Culture, Respiratory w Gram Stain     Status: None (Preliminary result)   Collection Time: 01/21/21  7:48 PM  Result Value Ref Range Status   Specimen Description EXPECTORATED SPUTUM  Final   Special Requests Normal Reflexed from [id number]  Final   Gram Stain   Final    ABUNDANT WBC PRESENT, PREDOMINANTLY PMN FEW GRAM POSITIVE COCCI RARE GRAM NEGATIVE RODS    Culture   Final    CULTURE REINCUBATED FOR BETTER GROWTH Performed at [facility], [address] [place], [place] [address]    Report Status PENDING  Incomplete         Radiology Studies: No results found.      Scheduled Meds:  enoxaparin (LOVENOX) injection  40 mg Subcutaneous Q24H   feeding supplement  237 mL Oral TID AC & HS   insulin aspart  0-9 Units Subcutaneous TID WC & HS   insulin aspart  2 Units Subcutaneous TID WC   insulin glargine-yfgn   15 Units Subcutaneous Daily   multivitamin with minerals  1 tablet Oral Daily   Continuous Infusions:  lactated ringers Stopped (01/23/21 0827)     LOS: 6 days    Time spent: 27 minutes    Marrion Coyekui Zariah Jost, MD [facility]   To contact the attending provider between 7A-7P or the covering provider during after hours 7P-7A, please log into the web site [email or web address] and access using universal [place] password for that web site. If you do not have the password, please call the hospital operator.  01/23/2021, 11:02 AM

## 2021-01-23 NOTE — TOC Progression Note (Signed)
Transition of Care [facility]) - Progression Note    Patient Details  Name: Patrick Cisneros MRN: [id number] Date of Birth: [birth year]  [facility] CM/SW Contact  Lawerance Sabal, RN Phone Number: 01/23/2021, 4:07 PM  Clinical Narrative:    Received call from [facility] inquiring about address he will DC to. Confirmed with patient that he will go to address on file and his cell phone number listed is accurate. [facility] has access to this note.   [address],  [place] [address]  [phone] (Mobile)    Expected Discharge Plan: Home/Self Care Barriers to Discharge: Continued Medical Work up  Expected Discharge Plan and Services Expected Discharge Plan: Home/Self Care   Discharge Planning Services: CM Consult, Indigent Health Clinic Post Acute Care Choice: NA Living arrangements for the past 2 months: Single Family Home                 DME Arranged: N/A         HH Arranged: NA           Social Determinants of Health (SDOH) Interventions    Readmission Risk Interventions No flowsheet data found.

## 2021-01-24 ENCOUNTER — Other Ambulatory Visit (HOSPITAL_COMMUNITY): Payer: Self-pay

## 2021-01-24 ENCOUNTER — Telehealth: Payer: Self-pay | Admitting: Student

## 2021-01-24 DIAGNOSIS — J852 Abscess of lung without pneumonia: Secondary | ICD-10-CM

## 2021-01-24 LAB — CULTURE, RESPIRATORY W GRAM STAIN
Culture: NORMAL
Special Requests: NORMAL

## 2021-01-24 LAB — GLUCOSE, CAPILLARY
Glucose-Capillary: 270 mg/dL — ABNORMAL HIGH (ref 70–99)
Glucose-Capillary: 253 mg/dL — ABNORMAL HIGH (ref 70–99)

## 2021-01-24 MED ORDER — ENSURE ENLIVE PO LIQD: 237.0000 mL | Freq: Three times a day (TID) | ORAL | 0 refills | Status: DC

## 2021-01-24 MED ORDER — ATORVASTATIN CALCIUM 20 MG PO TABS
20.0000 mg | ORAL_TABLET | Freq: Every day | ORAL | 1 refills | Status: AC
Start: 2021-01-24 — End: 2021-07-23

## 2021-01-24 MED ORDER — AMOXICILLIN-POT CLAVULANATE 875-125 MG PO TABS
1.0000 | ORAL_TABLET | Freq: Two times a day (BID) | ORAL | 0 refills | Status: DC
  Filled 2021-01-24: qty 60, 30d supply, fill #0

## 2021-01-24 MED ORDER — NOVOLIN 70/30 FLEXPEN RELION (70-30) 100 UNIT/ML ~~LOC~~ SUPN: 10.0000 [IU] | PEN_INJECTOR | Freq: Two times a day (BID) | SUBCUTANEOUS | 1 refills | Status: AC

## 2021-01-24 MED ORDER — AMOXICILLIN-POT CLAVULANATE 875-125 MG PO TABS: 1.0000 | ORAL_TABLET | Freq: Two times a day (BID) | ORAL | 0 refills | Status: DC

## 2021-01-24 MED ORDER — INSULIN GLARGINE 100 UNIT/ML SOLOSTAR PEN: 20.0000 [IU] | PEN_INJECTOR | Freq: Every day | SUBCUTANEOUS | 1 refills | Status: DC

## 2021-01-24 MED ORDER — INSULIN GLARGINE 100 UNIT/ML SOLOSTAR PEN
20.0000 [IU] | PEN_INJECTOR | Freq: Every day | SUBCUTANEOUS | 1 refills | Status: DC
  Filled 2021-01-24: qty 6, 30d supply, fill #0

## 2021-01-24 MED ORDER — ADULT MULTIVITAMIN W/MINERALS CH: 1.0000 | ORAL_TABLET | Freq: Every day | ORAL | Status: DC

## 2021-01-24 MED ORDER — BLOOD GLUCOSE MONITOR KIT: PACK | 0 refills | Status: AC

## 2021-01-24 MED ORDER — PEN NEEDLES 31G X 5 MM MISC: 1 refills | Status: AC

## 2021-01-24 MED ORDER — NOVOLIN 70/30 FLEXPEN RELION (70-30) 100 UNIT/ML ~~LOC~~ SUPN: 10.0000 [IU] | PEN_INJECTOR | Freq: Two times a day (BID) | SUBCUTANEOUS | 1 refills | Status: DC

## 2021-01-24 MED ORDER — AMOXICILLIN-POT CLAVULANATE 875-125 MG PO TABS
1.0000 | ORAL_TABLET | Freq: Two times a day (BID) | ORAL | 0 refills | Status: AC
  Filled 2021-01-24 (×2): qty 84, 42d supply, fill #0

## 2021-01-24 MED ORDER — ENSURE ENLIVE PO LIQD: 237.0000 mL | Freq: Three times a day (TID) | ORAL | 0 refills | Status: AC

## 2021-01-24 MED ORDER — ADULT MULTIVITAMIN W/MINERALS CH: 1.0000 | ORAL_TABLET | Freq: Every day | ORAL | Status: AC

## 2021-01-24 NOTE — Progress Notes (Signed)
PCCM Brief Progress Note  Lung abscesses: - if 3 AFB smears negative no need for bronch - augmentin 6 week course - repeat CT Chest in 6 weeks which I've ordered along with clinic follow up in our clinic - Can TOC or financial counselor help with this given his financial insecurity?  We will sign off but glad to be reinvolved as condition changes  Laroy Apple Pulmonary/Critical Care

## 2021-01-24 NOTE — Care Management (Signed)
NCM was asked to [facility] patient , to assist with discharge prescriptions. NCM confirmed with [facility] supervisor that patient has medicare A , so does not qualify for [facility] assistance.    MD and pharmacy have adjusted discharge prescriptions.   Patient antibiotic cost is $20.00. NCM received approval to use petty cash for cost. NCM will take [facility] $20.00 and they will deliver antibiotic to patient's room

## 2021-01-24 NOTE — Telephone Encounter (Signed)
Can we set up clinic follow up in my clinic or jane's in 6 weeks?

## 2021-01-24 NOTE — Discharge Summary (Signed)
Physician Discharge Summary  Patrick Cisneros XEN:[id number] DOB: 12-18-1970 DOA: 01/17/2021  PCP: Pcp, No  Admit date: 01/17/2021 Discharge date: 01/24/2021 Admitted From: Home Disposition: Home Recommendations for Outpatient Follow-up:  Follow ups as below. Please obtain CBC/BMP/Mag at follow up Please follow up on the following pending results: Acid-fast culture Home Health: Not indicated Equipment/Devices: Not indicated Discharge Condition: Stable CODE STATUS: Full code  Follow-up Information     Kerin Perna, NP Follow up.   Specialty: Internal Medicine Why: February 19, 2021 at 2:10 pm Contact information: [address] [place] [address] [phone]                Hospital Course: 51 year old M with PMH of uncontrolled IDDM-2 (has been out of his insulin for past few months) presenting with cough for about 3 months since he was diagnosed with COVID-19 (along with hemoptysis, intermittent right upper quadrant pain as well as 25 pound unintentional weight loss in the past month, and admitted for pneumonia with gland abscesses and rule out tuberculosis.  Started on ceftriaxone and azithromycin. Pulmonology consulted.  HIV, COVID-19 and influenza PCR nonreactive.  Respiratory cultures negative.  AFB smear negative x2.  Acid-fast cultures are pending.  Patient's symptoms improved.  He was cleared for discharge by pulmonology on p.o. Augmentin for 6 weeks that was filled at [facility] and delivered to bedside along with his atorvastatin..  Pulmonology to arrange outpatient follow-up for repeat CT.  In regards to his uncontrolled diabetes, A1c 12.7%.  Patient has Medicare a and B but no prescription coverage.  He is discharged on ReliOn Novolin 70/30 at 10 units twice daily.  He has been started on atorvastatin 20 mg daily as well.  Patient states he is able to afford the ReliOn insulin.    See individual problem list below for more on hospital  course.  Discharge Diagnoses:  Pneumonia with pulm abscess-unlikely tuberculosis.  AFB negative. -P.o. Augmentin for 6 weeks -Outpatient follow-up with pulmonology -Follow AF cultures  Uncontrolled IDDM-2 with hyperglycemia and hyperlipidemia: A1c 12.7%.  Has been off his insulin for months. -Discharged on ReliOn Novolin 70/30 at 10 units twice daily-equivalent to total insulin he has received in-house -Started on atorvastatin -History of metformin intolerance    Severe protein calorie malnutrition/unintentional weight loss- -Optimize diabetic care Body mass index is 19.17 kg/m. Nutrition Problem: Severe Malnutrition Etiology: chronic illness (T2DM) Signs/Symptoms: energy intake < or equal to 75% for > or equal to 1 month, severe muscle depletion, severe fat depletion Interventions: Ensure Enlive (each supplement provides 350kcal and 20 grams of protein), MVI     Discharge Exam: Vitals:   01/23/21 1632 01/23/21 2040 01/24/21 0510 01/24/21 0741  BP: (!) 148/98 140/82 132/83 113/63  Pulse: 88 94 96 81  Temp: 98.4 F (36.9 C) 98.9 F (37.2 C) 98.4 F (36.9 C) 98.2 F (36.8 C)  Resp: '19 18 17 20  ' Height:      Weight:      SpO2: 100% 100% 100% 100%  TempSrc:  Oral  Oral  BMI (Calculated):         GENERAL: No apparent distress.  Nontoxic. HEENT: MMM.  Vision and hearing grossly intact.  NECK: Supple.  No apparent JVD.  RESP: 100% on RA.  No IWOB.  Fair aeration bilaterally. CVS:  RRR. Heart sounds normal.  ABD/GI/GU: Bowel sounds present. Soft. Non tender.  MSK/EXT:  Moves extremities. No apparent deformity. No edema.  SKIN: no apparent skin lesion or wound NEURO:  Awake and alert.  Oriented appropriately.  No apparent focal neuro deficit. PSYCH: Calm. Normal affect.   Discharge Instructions  Discharge Instructions     Call MD for:  difficulty breathing, headache or visual disturbances   Complete by: As directed    Call MD for:  extreme fatigue   Complete by: As  directed    Call MD for:  temperature >100.4   Complete by: As directed    Diet general   Complete by: As directed    Discharge instructions   Complete by: As directed    It has been a pleasure taking care of you!  You were hospitalized due to pneumonia with abscess for which you have been started on antibiotics.  It is very important that you continue taking these antibiotics until you complete the treatment course.  We strongly recommend taking your other medications as prescribed as well.  Follow-up with your primary care doctor in 1 to 2 weeks.  Pulmonology (lung doctor) will arrange outpatient follow-up   Take care,   Increase activity slowly   Complete by: As directed       Allergies as of 01/24/2021   No Known Allergies      Medication List     STOP taking these medications    HYDROcodone-acetaminophen 5-325 MG tablet Commonly known as: NORCO/VICODIN   insulin glargine 100 UNIT/ML injection Commonly known as: LANTUS       TAKE these medications    amoxicillin-clavulanate 875-125 MG tablet Commonly known as: AUGMENTIN Take 1 tablet by mouth every 12 (twelve) hours.   atorvastatin 20 MG tablet Commonly known as: Lipitor Take 1 tablet (20 mg total) by mouth daily.   blood glucose meter kit and supplies Kit Use two to three times daily to check your blood glucose   feeding supplement Liqd Take 237 mLs by mouth 4 (four) times daily -  before meals and at bedtime.   multivitamin with minerals Tabs tablet Take 1 tablet by mouth daily. Start taking on: January 25, 2021   NovoLIN 70/30 Kwikpen (70-30) 100 UNIT/ML KwikPen Generic drug: insulin isophane & regular human KwikPen Inject 10 Units into the skin in the morning and at bedtime.   Pen Needles 31G X 5 MM Misc Use to inject insulin        Consultations: Pulmonology  Procedures/Studies:   DG Chest 2 View  Result Date: 01/17/2021 CLINICAL DATA:  Cough with hemoptysis EXAM: CHEST - 2 VIEW  COMPARISON:  None. FINDINGS: Cardiac and mediastinal contours within normal limits. Focal right upper lobe consolidation and probable right middle lobe consolidation. No evidence of pleural effusion or pneumothorax. IMPRESSION: Focal right upper lobe consolidation and probable right middle lobe consolidation. Given chronicity of symptoms and history of hemoptysis, recommend further evaluation with contrast-enhanced CT to evaluate for neoplasm. Electronically Signed   By: Yetta Glassman M.D.   On: 01/17/2021 13:35   CT Chest W Contrast  Result Date: 01/17/2021 CLINICAL DATA:  Coughing for 1 week and episodes of hemoptysis. Abnormal chest x-ray. EXAM: CT CHEST WITH CONTRAST TECHNIQUE: Multidetector CT imaging of the chest was performed during intravenous contrast administration. CONTRAST:  156m OMNIPAQUE IOHEXOL 300 MG/ML  SOLN COMPARISON:  Chest x-ray, same date. FINDINGS: Cardiovascular: The heart is normal in size. No pericardial effusion. The aorta is normal in caliber. No dissection or arthrosclerotic calcification. The branch vessels are patent. No definite coronary artery calcifications. The pulmonary arteries are grossly normal. Mediastinum/Nodes: Mildly enlarged right hilar and mediastinal lymph  nodes most likely reactive/inflammatory given the right lung findings. The esophagus is grossly normal. Lungs/Pleura: As demonstrated on the chest x-ray there is a rounded density in the right upper lobe peripherally which appears necrotic with air-fluid levels. This measures a maximum 4.8 cm. There is a second nodular lesions gest posterior and inferior to this area along with several other patchy ill-defined airspace opacities and interstitial changes around it. Ill-defined coalescent nodular opacities in the right middle lobe also adjacent to the minor fissure measuring a maximum of 2.8 cm. Small patchy ground-glass type infiltrates are noted in the suspect superior segment of the right lower lobe. No  pleural effusion. The left lung is clear. Think the findings are much more likely due to infection and neoplasm and recommend aggressive antibiotic treatment and follow-up chest x-ray in 3 weeks to make sure this is resolving. Upper Abdomen: No significant upper abdominal findings. Musculoskeletal: No chest wall mass, supraclavicular or axillary adenopathy. The thyroid gland is unremarkable. The bony thorax is intact. IMPRESSION: 1. 4.8 cm necrotic appearing right upper lobe pulmonary lesion, most likely a lung abscess with several other patchy ill-defined airspace opacities. Findings are much more likely due to infection than neoplasm and recommend aggressive antibiotic treatment and follow-up chest x-ray in 3 weeks to make sure this is resolving. 2. Mildly enlarged right hilar and mediastinal lymph nodes, most likely reactive/inflammatory given the right lung findings. 3. No pleural effusion and the left lung is clear. Electronically Signed   By: Marijo Sanes M.D.   On: 01/17/2021 21:48   CT ABDOMEN PELVIS W CONTRAST  Result Date: 01/18/2021 CLINICAL DATA:  Unintended weight loss, history of cough and hemoptysis EXAM: CT ABDOMEN AND PELVIS WITH CONTRAST TECHNIQUE: Multidetector CT imaging of the abdomen and pelvis was performed using the standard protocol following bolus administration of intravenous contrast. CONTRAST:  53m OMNIPAQUE IOHEXOL 300 MG/ML  SOLN COMPARISON:  01/17/2021 FINDINGS: Lower chest: No acute pleural or parenchymal lung disease. Please refer to chest CT performed earlier this evening. Hepatobiliary: No focal liver abnormality is seen. No gallstones, gallbladder wall thickening, or biliary dilatation. Pancreas: Unremarkable. No pancreatic ductal dilatation or surrounding inflammatory changes. Spleen: Normal in size without focal abnormality. Adrenals/Urinary Tract: Excreted contrast from the CT angiography performed earlier. No filling defects within the bladder. Kidneys enhance normally  and symmetrically. The adrenals are stable. Stomach/Bowel: No bowel obstruction or ileus. I believe a normal appendix is seen overlying the right psoas muscle. No bowel wall thickening or inflammatory change. Vascular/Lymphatic: Aortic atherosclerosis. No enlarged abdominal or pelvic lymph nodes. Reproductive: Prostate is unremarkable. Other: No free intraperitoneal fluid or free gas. No abdominal wall hernia. Musculoskeletal: No acute or destructive bony lesions. Reconstructed images demonstrate no additional findings. IMPRESSION: 1. No acute intra-abdominal or intrapelvic process. 2.  Aortic Atherosclerosis (ICD10-I70.0). Electronically Signed   By: MRanda NgoM.D.   On: 01/18/2021 00:32       The results of significant diagnostics from this hospitalization (including imaging, microbiology, ancillary and laboratory) are listed below for reference.     Microbiology: Recent Results (from the past 240 hour(s))  Resp Panel by RT-PCR (Flu A&B, Covid) Nasopharyngeal Swab     Status: None   Collection Time: 01/17/21  1:07 PM   Specimen: Nasopharyngeal Swab; Nasopharyngeal(NP) swabs in vial transport medium  Result Value Ref Range Status   SARS Coronavirus 2 by RT PCR NEGATIVE NEGATIVE Final    Comment: (NOTE) SARS-CoV-2 target nucleic acids are NOT DETECTED.  The SARS-CoV-2 RNA  is generally detectable in upper respiratory specimens during the acute phase of infection. The lowest concentration of SARS-CoV-2 viral copies this assay can detect is 138 copies/mL. A negative result does not preclude SARS-Cov-2 infection and should not be used as the sole basis for treatment or other patient management decisions. A negative result may occur with  improper specimen collection/handling, submission of specimen other than nasopharyngeal swab, presence of viral mutation(s) within the areas targeted by this assay, and inadequate number of viral copies(<138 copies/mL). A negative result must be combined  with clinical observations, patient history, and epidemiological information. The expected result is Negative.  Fact Sheet for Patients:  EntrepreneurPulse.com.au  Fact Sheet for Healthcare Providers:  IncredibleEmployment.be  This test is no t yet approved or cleared by the [place] FDA and  has been authorized for detection and/or diagnosis of SARS-CoV-2 by FDA under an Emergency Use Authorization (EUA). This EUA will remain  in effect (meaning this test can be used) for the duration of the COVID-19 declaration under Section 564(b)(1) of the Act, 21 U.S.C.section 360bbb-3(b)(1), unless the authorization is terminated  or revoked sooner.       Influenza A by PCR NEGATIVE NEGATIVE Final   Influenza B by PCR NEGATIVE NEGATIVE Final    Comment: (NOTE) The Xpert Xpress SARS-CoV-2/FLU/RSV plus assay is intended as an aid in the diagnosis of influenza from Nasopharyngeal swab specimens and should not be used as a sole basis for treatment. Nasal washings and aspirates are unacceptable for Xpert Xpress SARS-CoV-2/FLU/RSV testing.  Fact Sheet for Patients: EntrepreneurPulse.com.au  Fact Sheet for Healthcare Providers: IncredibleEmployment.be  This test is not yet approved or cleared by the [place] FDA and has been authorized for detection and/or diagnosis of SARS-CoV-2 by FDA under an Emergency Use Authorization (EUA). This EUA will remain in effect (meaning this test can be used) for the duration of the COVID-19 declaration under Section 564(b)(1) of the Act, 21 U.S.C. section 360bbb-3(b)(1), unless the authorization is terminated or revoked.  Performed at [facility], [place] [address], [place], [place] [address]   Blood culture (routine x 2)     Status: None   Collection Time: 01/17/21 11:01 PM   Specimen: BLOOD RIGHT FOREARM  Result Value Ref Range Status   Specimen Description BLOOD  RIGHT FOREARM  Final   Special Requests   Final    BOTTLES DRAWN AEROBIC AND ANAEROBIC Blood Culture adequate volume   Culture   Final    NO GROWTH 5 DAYS Performed at [facility], [place] [address], [place], [place] [address]    Report Status 01/22/2021 FINAL  Final  Expectorated Sputum Assessment w Gram Stain, Rflx to Resp Cult     Status: None   Collection Time: 01/18/21  2:31 PM   Specimen: Sputum  Result Value Ref Range Status   Specimen Description SPUTUM  Final   Special Requests NONE  Final   Sputum evaluation   Final    Sputum specimen not acceptable for testing.  Please recollect.   RESULT CALLED TO, READ BACK BY AND VERIFIED WITH: Altha Harm RN 01/18/2021 BY JW Performed at [facility], [place] [address], [place], [place] [address]    Report Status 01/18/2021 FINAL  Final  Acid Fast Smear (AFB)     Status: None   Collection Time: 01/19/21 10:04 AM   Specimen: Sputum  Result Value Ref Range Status   AFB Specimen Processing Concentration  Final   Acid Fast Smear  Negative  Final    Comment: (NOTE) Performed At: [facility] [place], [place] [id number] Rush Farmer MD [id number]    Source (AFB) SPUTUM  Final    Comment: Performed at [facility], [place] [address] [place], [place] [address]  Expectorated Sputum Assessment w Gram Stain, Rflx to Resp Cult     Status: None   Collection Time: 01/20/21  5:33 AM   Specimen: Sputum  Result Value Ref Range Status   Specimen Description SPUTUM  Final   Special Requests Normal  Final   Sputum evaluation   Final    THIS SPECIMEN IS ACCEPTABLE FOR SPUTUM CULTURE Performed at [facility], [place] [address], [place], [place] [address]    Report Status 01/20/2021 FINAL  Final  Culture, Respiratory w Gram Stain     Status: None   Collection Time: 01/20/21  5:33 AM   Specimen: SPU  Result Value Ref Range Status   Specimen Description SPUTUM  Final   Special Requests Normal Reflexed from  [id number]  Final   Gram Stain   Final    NO SQUAMOUS EPITHELIAL CELLS SEEN FEW WBC SEEN NO ORGANISMS SEEN    Culture   Final    RARE Normal respiratory flora-no Staph aureus or Pseudomonas seen Performed at [facility], [place] [address], [place], [place] [address]    Report Status 01/22/2021 FINAL  Final  Acid Fast Smear (AFB)     Status: None   Collection Time: 01/21/21  7:46 PM   Specimen: Sputum  Result Value Ref Range Status   AFB Specimen Processing Concentration  Final   Acid Fast Smear Negative  Final    Comment: (NOTE) Performed At: [facility] [place], [place] [id number] Rush Farmer MD [id number]    Source (AFB) EXPECTORATED SPUTUM  Final    Comment: Performed at [facility], [place] [address] [place], [place] [address]  Expectorated Sputum Assessment w Gram Stain, Rflx to Resp Cult     Status: None   Collection Time: 01/21/21  7:48 PM   Specimen: Expectorated Sputum  Result Value Ref Range Status   Specimen Description EXPECTORATED SPUTUM  Final   Special Requests Normal  Final   Sputum evaluation   Final    THIS SPECIMEN IS ACCEPTABLE FOR SPUTUM CULTURE Performed at [facility], [place] [address] [place], [place] [address]    Report Status 01/21/2021 FINAL  Final  Culture, Respiratory w Gram Stain     Status: None   Collection Time: 01/21/21  7:48 PM  Result Value Ref Range Status   Specimen Description EXPECTORATED SPUTUM  Final   Special Requests Normal Reflexed from [id number]  Final   Gram Stain   Final    ABUNDANT WBC PRESENT, PREDOMINANTLY PMN FEW GRAM POSITIVE COCCI RARE GRAM NEGATIVE RODS    Culture   Final    FEW Normal respiratory flora-no Staph aureus or Pseudomonas seen Performed at [facility], [place] [address], [place], [place] [address]    Report Status 01/24/2021 FINAL  Final     Labs:  CBC: Recent Labs  Lab 01/18/21 0240 01/19/21 0047 01/21/21 0042  WBC 12.1* 10.8* 9.5  HGB 11.8* 12.0*  12.4*  HCT 35.1* 36.9* 36.5*  MCV 90.0 90.0 89.2  PLT 589* 624* 605*   BMP &GFR Recent Labs  Lab 01/18/21 0240 01/21/21 0042  NA 132* 133*  K 3.7 3.9  CL  98 99  CO2 26 26  GLUCOSE 357* 122*  BUN 9 14  CREATININE 0.59* 0.68  CALCIUM 8.8* 8.8*   Estimated Creatinine Clearance: 101.8 mL/min (by C-G formula based on SCr of 0.68 mg/dL). Liver & Pancreas: Recent Labs  Lab 01/18/21 0240  AST 12*  ALT 17  ALKPHOS 128*  BILITOT 0.3  PROT 6.5  ALBUMIN 2.6*   No results for input(s): LIPASE, AMYLASE in the last 168 hours. No results for input(s): AMMONIA in the last 168 hours. Diabetic: No results for input(s): HGBA1C in the last 72 hours. Recent Labs  Lab 01/23/21 1142 01/23/21 1731 01/23/21 2100 01/24/21 0738 01/24/21 1158  GLUCAP 188* 266* 218* 270* 253*   Cardiac Enzymes: No results for input(s): CKTOTAL, CKMB, CKMBINDEX, TROPONINI in the last 168 hours. No results for input(s): PROBNP in the last 8760 hours. Coagulation Profile: No results for input(s): INR, PROTIME in the last 168 hours. Thyroid Function Tests: No results for input(s): TSH, T4TOTAL, FREET4, T3FREE, THYROIDAB in the last 72 hours. Lipid Profile: No results for input(s): CHOL, HDL, LDLCALC, TRIG, CHOLHDL, LDLDIRECT in the last 72 hours. Anemia Panel: No results for input(s): VITAMINB12, FOLATE, FERRITIN, TIBC, IRON, RETICCTPCT in the last 72 hours. Urine analysis: No results found for: COLORURINE, APPEARANCEUR, LABSPEC, PHURINE, GLUCOSEU, HGBUR, BILIRUBINUR, KETONESUR, PROTEINUR, UROBILINOGEN, NITRITE, LEUKOCYTESUR Sepsis Labs: Invalid input(s): PROCALCITONIN, LACTICIDVEN   Time coordinating discharge: 45 minutes  SIGNED:  Mercy Riding, MD  [facility] 01/24/2021, 5:56 PM

## 2021-01-24 NOTE — Progress Notes (Addendum)
Inpatient Diabetes Program Recommendations  AACE/ADA: New Consensus Statement on Inpatient Glycemic Control   Target Ranges:  Prepandial:   less than 140 mg/dL      Peak postprandial:   less than 180 mg/dL (1-2 hours)      Critically ill patients:  140 - 180 mg/dL    Latest Reference Range & Units 01/24/21 07:38 01/24/21 11:58  Glucose-Capillary 70 - 99 mg/dL 270 (H) 253 (H)    Latest Reference Range & Units 01/23/21 08:24 01/23/21 11:42 01/23/21 17:31 01/23/21 21:00  Glucose-Capillary 70 - 99 mg/dL 280 (H) 188 (H) 266 (H) 218 (H)   Review of Glycemic Control  Diabetes history: DM2 Outpatient Diabetes medications: Lantus 15 units QHS (out of insulin for past 3 months) Current orders for Inpatient glycemic control: Semglee 15 units daily, Novolog 0-9 units AC&HS, Novolog 2 units TID with meals for meal coverage  Inpatient Diabetes Program Recommendations:    Outpatient insulin: May want to consider discharging on Novolin 70/30 insulin pens [id number]) which would be $43 per box of 5 insulin pens at [facility].   Inpatient insulin regimen: If Semglee continued inpatient, please consider increasing Semglee to 20 units QHS and meal coverage to Novolog 3 units TID with meals.   If provider would like to change insulin regimen to 70/30 (due to cost), would recommend to discontinue Semglee and Novolog 2 units TID meal coverage and order 70/30 15 units BID (dose would provide a total of 21 units for basal and 9 units for meal coverage per day).   NOTE: Noted consult for assistance with cheapest insulin brand and supplies. Per TOC note today, patient has Medicare A so patient does not qualify for [facility] assistance. Since cost is an issue, would recommend to discharge patient on Novolin 70/30 insulin which is $25 per vial or $43 per box of 5 insulin pens at [facility]. Would also recommend patient get Reli-On Classic glucometer ($9) and box of 50 test strips ($9) at [facility]. Will plan to follow up with patient  today and discuss more affordable insulin and Reli-On testing supplies at [facility]. Addendum 01/24/21@13 :35-Went by to see patient but already discharged. Called patient on his cell phone. Discussed Novolin 70/30 insulin and how it is taken. Informed patient that Novolin 70/30 insulin pens are $43 per box of 5 pens or $25 per vial at [facility]. Patient states that his prescriptions were sent to [facility] and he plans to go pick up insulin from there. Informed patient that if his copay is expensive at [facility], he may want to consider asking [facility] to transfer his prescription to [place]. Patient states that he plans to get a new glucometer and testing supplies at [facility] as well. Informed patient that [facility] has Reli-On Classic glucometer for $9 and box of 50 test strips for $9. Again encouraged patient to consider getting glucometer and testing supplies at [facility] if it is more affordable that [facility]. Encouraged patient to check glucose and take DM medication consistently to get DM under better control. Patient states that he plans to get back to working out which should help with his glucose control. Patient states that he is working on trying to get his insurance straightened out and he reports that he will be able to get everything he needs for now.  Patient appreciative of call and information discussed and states that he has no questions at this time.  Thanks, Patrick Alderman, RN, MSN, CDE Diabetes Coordinator Inpatient Diabetes Program [phone] (Team Pager from 8am to 5pm)

## 2021-01-24 NOTE — Progress Notes (Signed)
Mobility Specialist Progress Note:   01/24/21 1300  Mobility  Activity Ambulated in room  Level of Assistance Independent  Assistive Device None  Minutes Ambulated 5 minutes  Mobility Ambulated independently in hallway  Mobility Response Tolerated well  Mobility performed by Mobility specialist  $Mobility charge 1 Mobility   Pt asx during ambulation. Denies SOB/wheezing. Pt d/c at end of session.  Addison Lank Mobility Specialist  Phone [phone]

## 2021-01-25 LAB — ACID FAST SMEAR (AFB, MYCOBACTERIA): Acid Fast Smear: NEGATIVE

## 2021-01-25 NOTE — Telephone Encounter (Signed)
Spoke with the pt and scheduled appt with Dr Thora Lance for 03/14/21 at 10:30 am.

## 2021-02-14 ENCOUNTER — Ambulatory Visit (HOSPITAL_COMMUNITY): Payer: Self-pay

## 2021-02-19 ENCOUNTER — Inpatient Hospital Stay (INDEPENDENT_AMBULATORY_CARE_PROVIDER_SITE_OTHER): Payer: Self-pay | Admitting: Primary Care

## 2021-02-21 LAB — MAC SUSCEPTIBILITY BROTH
Ciprofloxacin: >8
Clarithromycin: 1
Doxycycline: 8
Linezolid: 32
Minocycline: >8
Rifabutin: 0.25
Rifampin: >4
Streptomycin: 32

## 2021-02-21 LAB — AFB ORGANISM ID BY DNA PROBE
M avium complex: POSITIVE — AB
M tuberculosis complex: NEGATIVE

## 2021-02-21 LAB — ACID FAST CULTURE WITH REFLEXED SENSITIVITIES (MYCOBACTERIA): Acid Fast Culture: POSITIVE — AB

## 2021-03-08 LAB — ACID FAST CULTURE WITH REFLEXED SENSITIVITIES (MYCOBACTERIA): Acid Fast Culture: NEGATIVE

## 2021-03-09 LAB — ACID FAST CULTURE WITH REFLEXED SENSITIVITIES (MYCOBACTERIA): Acid Fast Culture: NEGATIVE

## 2021-03-12 ENCOUNTER — Ambulatory Visit (HOSPITAL_COMMUNITY): Payer: Self-pay

## 2021-03-12 NOTE — Progress Notes (Unsigned)
Synopsis: Referred for lung abscesses by No ref. provider found  Subjective:   PATIENT ID: Patrick Cisneros GENDER: male DOB: [birth year], MRN: [id number]  No chief complaint on file.  51 y/o M with a PMH of DM and COVID about 3 months PTA when-was not hospitalized-was only sick for a few days, reformed cigarette smoker recently discharged 01/24/21 following hospitalization for lung abscesses, pneumonia. He was given course of augmentin for 6 weeks.    Otherwise pertinent review of systems is negative.  Past Medical History:  Diagnosis Date   Diabetes mellitus without complication ([place])      No family history on file.   No past surgical history on file.  Social History   Socioeconomic History   Marital status: Single    Spouse name: Not on file   Number of children: Not on file   Years of education: Not on file   Highest education level: Not on file  Occupational History   Not on file  Tobacco Use   Smoking status: Never   Smokeless tobacco: Never  Substance and Sexual Activity   Alcohol use: Yes   Drug use: Not Currently   Sexual activity: Not on file  Other Topics Concern   Not on file  Social History Narrative   Not on file   Social Determinants of Health   Financial Resource Strain: Not on file  Food Insecurity: Not on file  Transportation Needs: Not on file  Physical Activity: Not on file  Stress: Not on file  Social Connections: Not on file  Intimate Partner Violence: Not on file     No Known Allergies   Outpatient Medications Prior to Visit  Medication Sig Dispense Refill   atorvastatin (LIPITOR) 20 MG tablet Take 1 tablet (20 mg total) by mouth daily. 90 tablet 1   blood glucose meter kit and supplies KIT Use two to three times daily to check your blood glucose 1 each 0   insulin isophane & regular human KwikPen (NOVOLIN 70/30 KWIKPEN) (70-30) 100 UNIT/ML KwikPen Inject 10 Units into the skin in the morning and at bedtime. 15 mL 1   Insulin Pen  Needle (PEN NEEDLES) 31G X 5 MM MISC Use to inject insulin 100 each 1   Multiple Vitamin (MULTIVITAMIN WITH MINERALS) TABS tablet Take 1 tablet by mouth daily.     No facility-administered medications prior to visit.       Objective:   Physical Exam:  General appearance: 51 y.o., male, NAD, conversant  Eyes: anicteric sclerae; PERRL, tracking appropriately HENT: NCAT; MMM Neck: Trachea midline; no lymphadenopathy, no JVD Lungs: CTAB, no crackles, no wheeze, with normal respiratory effort CV: RRR, no murmur  Abdomen: Soft, non-tender; non-distended, BS present  Extremities: No peripheral edema, warm Skin: Normal turgor and texture; no rash Psych: Appropriate affect Neuro: Alert and oriented to person and place, no focal deficit     There were no vitals filed for this visit.   on *** LPM *** RA BMI Readings from Last 3 Encounters:  01/19/21 19.17 kg/m   Wt Readings from Last 3 Encounters:  01/19/21 145 lb 4.5 oz (65.9 kg)  06/17/18 195 lb (88.5 kg)     CBC    Component Value Date/Time   WBC 9.5 01/21/2021 0042   RBC 4.09 (L) 01/21/2021 0042   HGB 12.4 (L) 01/21/2021 0042   HCT 36.5 (L) 01/21/2021 0042   PLT 605 (H) 01/21/2021 0042   MCV 89.2 01/21/2021 0042   MCH  30.3 01/21/2021 0042   MCHC 34.0 01/21/2021 0042   RDW 12.7 01/21/2021 0042   LYMPHSABS 2.6 01/17/2021 1316   MONOABS 1.2 (H) 01/17/2021 1316   EOSABS 0.0 01/17/2021 1316   BASOSABS 0.0 01/17/2021 1316    ***  Chest Imaging: CT Chest 01/17/21 reviewed by me with RUL necrotizing pneumonia/abscesses, RLL foci of TIBnodularity  Pulmonary Functions Testing Results: No flowsheet data found.  FeNO: ***  Pathology: ***  Echocardiogram: ***  Heart Catheterization: ***    Assessment & Plan:    Plan:      Maryjane Hurter, MD [facility] 03/12/2021 6:15 PM

## 2021-03-14 ENCOUNTER — Inpatient Hospital Stay: Payer: Self-pay | Admitting: Student

## 2021-06-01 ENCOUNTER — Emergency Department (HOSPITAL_COMMUNITY): Payer: Self-pay

## 2021-06-01 ENCOUNTER — Other Ambulatory Visit: Payer: Self-pay

## 2021-06-01 ENCOUNTER — Encounter (HOSPITAL_COMMUNITY): Payer: Self-pay

## 2021-06-01 ENCOUNTER — Emergency Department (HOSPITAL_COMMUNITY)
Admission: EM | Admit: 2021-06-01 | Discharge: 2021-06-02 | Disposition: A | Discharge status: Home / Self Care | Payer: Self-pay | Attending: Emergency Medicine | Admitting: Emergency Medicine

## 2021-06-01 DIAGNOSIS — Z720 Tobacco use: Secondary | ICD-10-CM | POA: Insufficient documentation

## 2021-06-01 DIAGNOSIS — R202 Paresthesia of skin: Secondary | ICD-10-CM | POA: Insufficient documentation

## 2021-06-01 DIAGNOSIS — M546 Pain in thoracic spine: Secondary | ICD-10-CM | POA: Insufficient documentation

## 2021-06-01 DIAGNOSIS — Z794 Long term (current) use of insulin: Secondary | ICD-10-CM | POA: Insufficient documentation

## 2021-06-01 DIAGNOSIS — M545 Low back pain, unspecified: Secondary | ICD-10-CM

## 2021-06-01 DIAGNOSIS — E119 Type 2 diabetes mellitus without complications: Secondary | ICD-10-CM | POA: Insufficient documentation

## 2021-06-01 NOTE — ED Provider Triage Note (Signed)
Emergency Medicine Provider Triage Evaluation Note ? ?Patrick Cisneros , a 51 y.o. male  was evaluated in triage.  Pt complains of mid/left-sided back pain.  Pain worse with motion or palpation.  Denies recent trauma, IV drug use, hemoptysis, recent cancer, or fever.  Denies urinary or bowel incontinence.  Denies new numbness or tingling of the lower extremities.  Hx homelessness and chronic tobacco use.  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 ? ?Review of Systems  ?Positive: As above ?Negative: As above ? ?Physical Exam  ?BP (!) 159/104 (BP Location: Right Arm)   Pulse 90   Temp 98.5 ?F (36.9 ?C) (Oral)   Resp 16   Ht 6\' 1"  (1.854 m)   Wt 65.9 kg   SpO2 100%   BMI 19.17 kg/m?  ?Gen:   Awake, no distress, laying comfortably ?Resp:  Normal effort, CTAB ?MSK:   Moves extremities without difficulty  ?Other:  Mild midline and left paraspinous tenderness near T5-T9.  No cervical or lumbar tenderness.  2+ PT pulses bilaterally. ? ?Medical Decision Making  ?Medically screening exam initiated at 2:12 PM.  Appropriate orders placed.  [facility] was informed that the remainder of the evaluation will be completed by another provider, this initial triage assessment does not replace that evaluation, and the importance of remaining in the ED until their evaluation is complete. ? ?Imaging ordered ?  ?[facility], PA-C ?06/01/21 1428 ? ?

## 2021-06-01 NOTE — ED Triage Notes (Signed)
Pt c/o back pain in mid back. Pt states he has tingling in his back. Pt states it's painful to stand or walk. Pt denies injury. Pt states the only comfortable position is laying on his back started this morning. Pt denies loss of bowel or bladder. ?

## 2021-06-02 ENCOUNTER — Emergency Department (HOSPITAL_COMMUNITY): Payer: Self-pay

## 2021-06-02 LAB — CBC WITH DIFFERENTIAL/PLATELET
Abs Immature Granulocytes: 0.01 10*3/uL (ref 0.00–0.07)
Basophils Absolute: 0 10*3/uL (ref 0.0–0.1)
Basophils Relative: 0 %
Eosinophils Absolute: 0.1 10*3/uL (ref 0.0–0.5)
Eosinophils Relative: 1 %
HCT: 44.2 % (ref 39.0–52.0)
Hemoglobin: 15 g/dL (ref 13.0–17.0)
Immature Granulocytes: 0 %
Lymphocytes Relative: 48 %
Lymphs Abs: 3.5 10*3/uL (ref 0.7–4.0)
MCH: 31.3 pg (ref 26.0–34.0)
MCHC: 33.9 g/dL (ref 30.0–36.0)
MCV: 92.1 fL (ref 80.0–100.0)
Monocytes Absolute: 0.8 10*3/uL (ref 0.1–1.0)
Monocytes Relative: 11 %
Neutro Abs: 3 10*3/uL (ref 1.7–7.7)
Neutrophils Relative %: 40 %
Platelets: 316 10*3/uL (ref 150–400)
RBC: 4.8 MIL/uL (ref 4.22–5.81)
RDW: 13.2 % (ref 11.5–15.5)
WBC: 7.5 10*3/uL (ref 4.0–10.5)
nRBC: 0 % (ref 0.0–0.2)

## 2021-06-02 LAB — BASIC METABOLIC PANEL
Anion gap: 8 (ref 5–15)
BUN: 9 mg/dL (ref 6–20)
CO2: 23 mmol/L (ref 22–32)
Calcium: 9.1 mg/dL (ref 8.9–10.3)
Chloride: 104 mmol/L (ref 98–111)
Creatinine, Ser: 0.83 mg/dL (ref 0.61–1.24)
GFR, Estimated: >60 mL/min (ref >60)
Glucose, Bld: 142 mg/dL — ABNORMAL HIGH (ref 70–99)
Potassium: 4.1 mmol/L (ref 3.5–5.1)
Sodium: 135 mmol/L (ref 135–145)

## 2021-06-02 LAB — CBG MONITORING, ED: Glucose-Capillary: 132 mg/dL — ABNORMAL HIGH (ref 70–99)

## 2021-06-02 MED ORDER — GADOBUTROL 1 MMOL/ML IV SOLN
6.5000 mL | Freq: Once | INTRAVENOUS | Status: AC | PRN
  Administered 2021-06-02: 6.5 mL via INTRAVENOUS

## 2021-06-02 MED ORDER — LIDOCAINE 5 % EX PTCH: 1.0000 | MEDICATED_PATCH | CUTANEOUS | 0 refills | Status: AC

## 2021-06-02 MED ORDER — OXYCODONE-ACETAMINOPHEN 5-325 MG PO TABS
1.0000 | ORAL_TABLET | Freq: Once | ORAL | Status: AC
  Administered 2021-06-02: 1 via ORAL
  Filled 2021-06-02: qty 1

## 2021-06-02 MED ORDER — CYCLOBENZAPRINE HCL 10 MG PO TABS: 10.0000 mg | ORAL_TABLET | Freq: Two times a day (BID) | ORAL | 0 refills | Status: AC | PRN

## 2021-06-02 MED ORDER — METHOCARBAMOL 500 MG PO TABS
1000.0000 mg | ORAL_TABLET | Freq: Once | ORAL | Status: AC
  Administered 2021-06-02: 1000 mg via ORAL
  Filled 2021-06-02: qty 2

## 2021-06-02 NOTE — ED Notes (Signed)
Pt verbalizes understanding of discharge instructions. Opportunity for questions and answers were provided. Pt discharged from the ED.   ?

## 2021-06-02 NOTE — ED Notes (Signed)
Pt asleep, resting comfortably at this time. Visible rise and fall of chest noted. Pt appears in NAD.  ?

## 2021-06-02 NOTE — ED Provider Notes (Signed)
?[facility] ?Provider Note ? ? ?CSN: [id number] ?Arrival date & time: 06/01/21  1256 ? ?  ? ?History ? ?Chief Complaint  ?Patient presents with  ? Back Pain  ? ? ?Patrick Cisneros is a 51 y.o. male. ? ?The history is provided by the patient and medical records.  ?Back Pain ?Patrick Cisneros is a 51 y.o. male who presents to the Emergency Department complaining of back pain.  He presents to the ED for evaluation of back pain that started Thursday night.  Pain is in the mid back.  He has no pain when still he has pain with standing, sitting bending.  Feels like a pinched nerve, tingling sensation.  Pain is nonradiating.   ? ?He has intermittent issues with pain in this area.   ? ?No fever, chest pain, sob, incontinence.  Has constipation.  No lower extremity numbness/weakness.  ? ?Has a hx/o DM.  Has chronic numbness to his feet.   ?  ?Uses tobacco.  No alcohol.  No illicit drugs besides marijuana.   ?His only medication is lantus.  ? ?Home Medications ?Prior to Admission medications   ?Medication Sig Start Date End Date Taking? Authorizing Provider  ?blood glucose meter kit and supplies KIT Use two to three times daily to check your blood glucose 01/24/21  Yes Wendee Beavers T, MD  ?insulin glargine (LANTUS) 100 UNIT/ML injection Inject 20 Units into the skin at bedtime.   Yes [provider]  ?Insulin Pen Needle (PEN NEEDLES) 31G X 5 MM MISC Use to inject insulin 01/24/21  Yes Gonfa, Taye T, MD  ?atorvastatin (LIPITOR) 20 MG tablet Take 1 tablet (20 mg total) by mouth daily. ?Patient not taking: Reported on 06/02/2021 01/24/21 07/23/21  Mercy Riding, MD  ?insulin isophane & regular human KwikPen (NOVOLIN 70/30 KWIKPEN) (70-30) 100 UNIT/ML KwikPen Inject 10 Units into the skin in the morning and at bedtime. ?Patient taking differently: Inject 25 Units into the skin at bedtime. 01/24/21   Mercy Riding, MD  ?Multiple Vitamin (MULTIVITAMIN WITH MINERALS) TABS tablet Take 1 tablet by  mouth daily. ?Patient not taking: Reported on 06/02/2021 01/25/21   Mercy Riding, MD  ?   ? ?Allergies    ?Motrin [ibuprofen]   ? ?Review of Systems   ?Review of Systems  ?Musculoskeletal:  Positive for back pain.  ?All other systems reviewed and are negative. ? ?Physical Exam ?Updated Vital Signs ?BP (!) 150/95 (BP Location: Right Arm)   Pulse 79   Temp 97.9 ?F (36.6 ?C)   Resp 18   Ht _0  (1.854 m)   Wt 65.9 kg   SpO2 99%   BMI 19.17 kg/m?  ?Physical Exam ?Vitals and nursing note reviewed.  ?Constitutional:   ?   Appearance: He is well-developed.  ?HENT:  ?   Head: Normocephalic and atraumatic.  ?Cardiovascular:  ?   Rate and Rhythm: Normal rate and regular rhythm.  ?   Heart sounds: No murmur heard. ?Pulmonary:  ?   Effort: Pulmonary effort is normal. No respiratory distress.  ?   Breath sounds: Normal breath sounds.  ?Abdominal:  ?   Palpations: Abdomen is soft.  ?   Tenderness: There is no abdominal tenderness. There is no guarding or rebound.  ?Musculoskeletal:     ?   General: No tenderness.  ?   Comments: 2+ DP pulses bilaterally.  There is no bony tenderness over the back but he does refer to the  lower thoracic/upper lumbar region when describing his pain.  ?Skin: ?   General: Skin is warm and dry.  ?Neurological:  ?   Mental Status: He is alert and oriented to person, place, and time.  ?   Comments: 5 out of 5 strength in proximal bilateral lower extremities.  He does have weakness on dorsiflexion and plantarflexion of the left foot-he states this is an ongoing issue but cannot specify how long it has been ongoing.  Sensations light touch intact in bilateral lower extremities but he does have altered sensation to the plantar surfaces of bilateral feet, this is an ongoing issue as well.  ?Psychiatric:     ?   Behavior: Behavior normal.  ? ? ?ED Results / Procedures / Treatments   ?Labs ?(all labs ordered are listed, but only abnormal results are displayed) ?Labs Reviewed  ?BASIC METABOLIC PANEL -  Abnormal; Notable for the following components:  ?    Result Value  ? Glucose, Bld 142 (*)   ? All other components within normal limits  ?CBC WITH DIFFERENTIAL/PLATELET  ?URINALYSIS, ROUTINE W REFLEX MICROSCOPIC  ? ? ?EKG ?None ? ?Radiology ?DG Thoracic Spine 2 View ? ?Result Date: 06/01/2021 ?CLINICAL DATA:  Upper back pain. EXAM: THORACIC SPINE 2 VIEWS COMPARISON:  None Available. FINDINGS: There is no evidence of thoracic spine fracture. Alignment is normal. No other significant bone abnormalities are identified. IMPRESSION: Negative. Electronically Signed   By: Marijo Conception M.D.   On: 06/01/2021 14:58  ? ?DG Lumbar Spine Complete ? ?Result Date: 06/01/2021 ?CLINICAL DATA:  Lower back pain. EXAM: LUMBAR SPINE - COMPLETE 4+ VIEW COMPARISON:  January 18, 2021. FINDINGS: There is no evidence of lumbar spine fracture. Alignment is normal. Intervertebral disc spaces are maintained. IMPRESSION: Negative. Electronically Signed   By: Marijo Conception M.D.   On: 06/01/2021 14:57   ? ?Procedures ?Procedures  ? ? ?Medications Ordered in ED ?Medications  ?methocarbamol (ROBAXIN) tablet 1,000 mg (1,000 mg Oral Given 06/02/21 0138)  ?oxyCODONE-acetaminophen (PERCOCET/ROXICET) 5-325 MG per tablet 1 tablet (1 tablet Oral Given 06/02/21 0138)  ? ? ?ED Course/ Medical Decision Making/ A&P ?  ?                        ?Medical Decision Making ?Amount and/or Complexity of Data Reviewed ?Labs: ordered. ?Radiology: ordered. ? ?Risk ?Prescription drug management. ? ? ?Patient here for evaluation of midline back pain, no reports of trauma.  This is an acute on chronic issue.  He does have a history of diabetes, recently admitted for lung abscess in December of this year.  No IV drug use but he is higher risk for epidural abscess.  Given his abnormal neurologic examination, immunosuppression feel that he would benefit from advanced imaging to rule out acute cord injury. ?Patient care transferred pending additional  imaging. ? ? ? ? ? ? ? ?Final Clinical Impression(s) / ED Diagnoses ?Final diagnoses:  ?None  ? ? ?Rx / DC Orders ?ED Discharge Orders   ? ? None  ? ?  ? ? ?  ?Quintella Reichert, MD ?06/02/21 [phone] ? ?

## 2021-06-02 NOTE — ED Notes (Signed)
Pt pacing hallway saying he has to have something to eat due to diabetes. RN explained to pt that imaging has not yet resulted and pt cannot eat. MD confirmed. Pt stated he did not care and was going to find food. MD stated that pt may require surgery and that pt should not eat, but may if was aware and verbalized understanding of possible delay. NT to check pts CBG. Pt talking over RN, becoming agitated, stating he cannot breathe yet continues speaking loudly in full sentences.  ?

## 2021-06-02 NOTE — ED Provider Notes (Signed)
?  Physical Exam  ?BP (!) 148/100   Pulse 88   Temp 97.9 ?F (36.6 ?C)   Resp 16   Ht 6\' 1"  (1.854 m)   Wt 65.9 kg   SpO2 98%   BMI 19.17 kg/m?  ? ?Physical Exam ? ?Procedures  ?Procedures ? ?ED Course / MDM  ?  ? ? ? ?Received care of patient from Dr. .  Please see her note for prior history, physical and care.  Briefly this is a 51 year old male who present with concern for back pain, and noted to have chronic weakness to dorsi and plantarflexion of the left foot.   He has a history of lung abscess, and MRI thoracic and lumbar spine were ordered with and without contrast to evaluate for signs of abnormality or infection. ? ? ?MRI returned and personally evaluated interpreted by me shows no evidence of acute abscess, cauda equina, or other emergent pathology. ? ?Symptoms may be related to other muscular pain.  Recommend follow-up with primary care physician.  Given prescription for Flexeril, lidocaine patch, recommend Tylenol.   ?  ?44, MD ?06/02/21 1104 ? ?

## 2024-01-07 IMAGING — MR MR THORACIC SPINE WO/W CM
6 of 13 series · 19 of 48 positions shown · IV contrast (6.5ml gadavist)
Comparison: None Available.

CLINICAL DATA: Acute myelopathy of the lumbar spine. Episodes of
mid back pain, atraumatic

EXAM:
MRI THORACIC AND LUMBAR SPINE WITHOUT AND WITH CONTRAST
TECHNIQUE: Multiplanar and multiecho pulse sequences of the thoracic and lumbar
spine were obtained without and with intravenous contrast.
CONTRAST:  6.5mL GADAVIST GADOBUTROL 1 MMOL/ML IV SOLN

[Series 2: T1 · sagittal · 3.0mm · 0.90mm/px · 3 of 12 slices shown (1 of 3)]
[im 1/12]
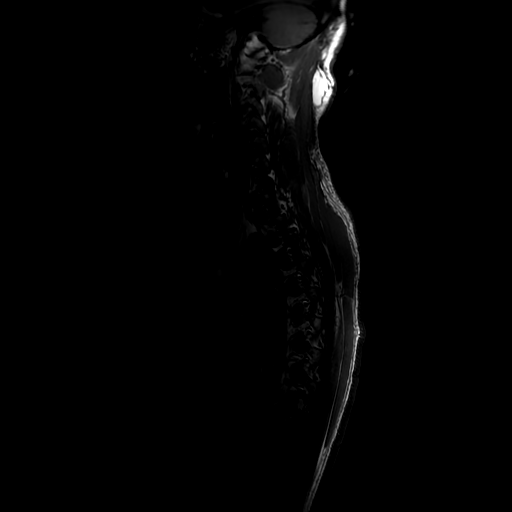
[im 6/12]
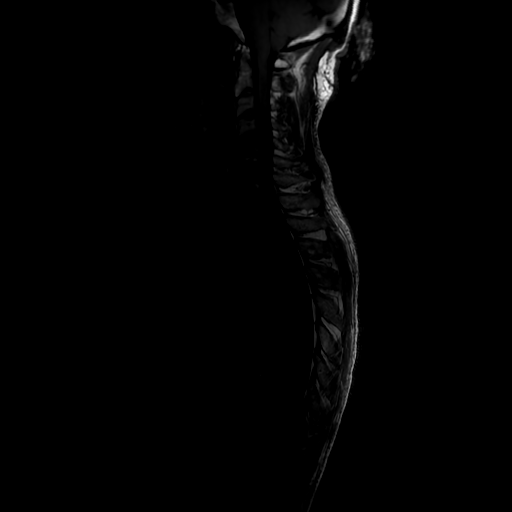
[im 12/12]
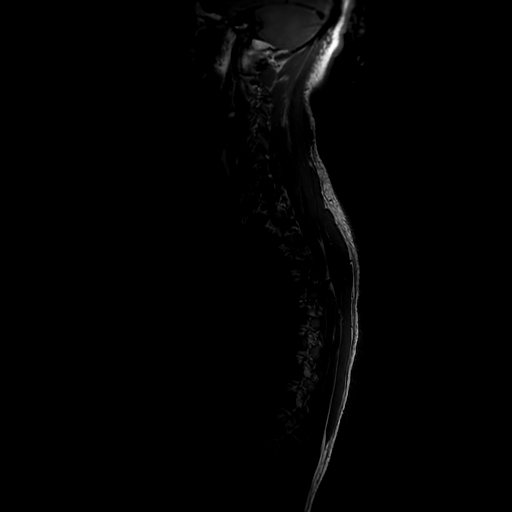

[Series 3: T2 · sagittal · 3.0mm · 0.66mm/px · 3 of 17 slices shown (1 of 3)]
[im 1/17]
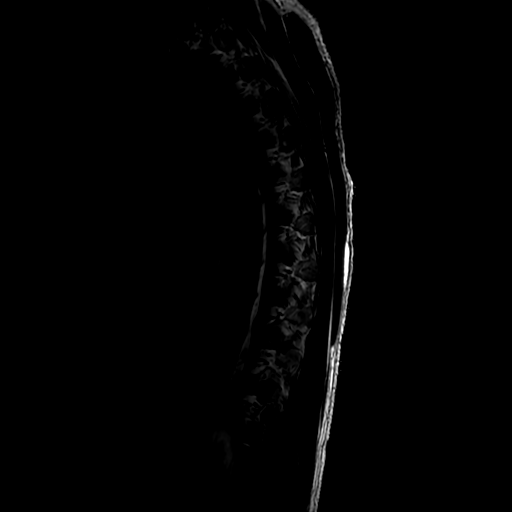
[im 9/17]
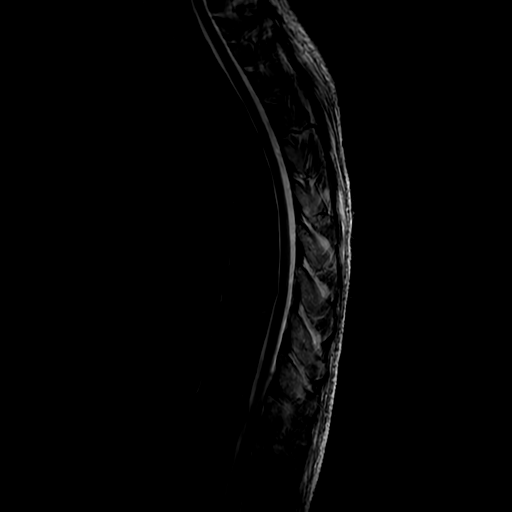
[im 17/17]
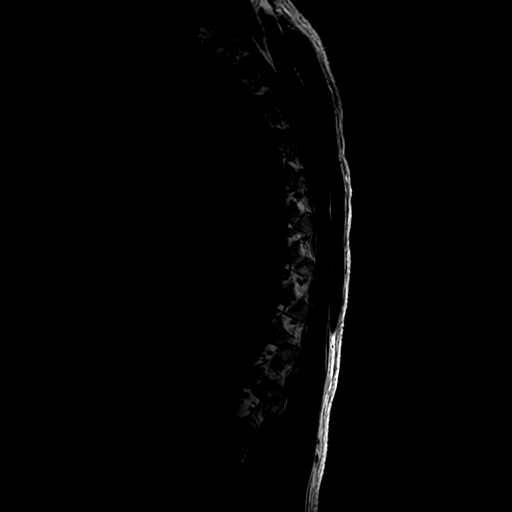

[Series 5: T1 · sagittal · 3.0mm · 0.66mm/px · 2 of 17 slices shown (2 of 3)]
[im 1/17]
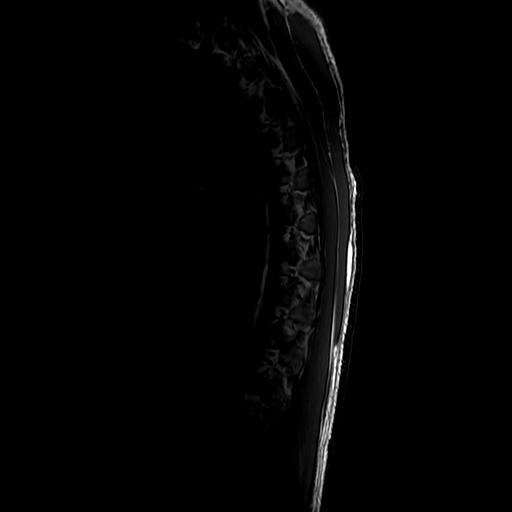
[im 17/17]
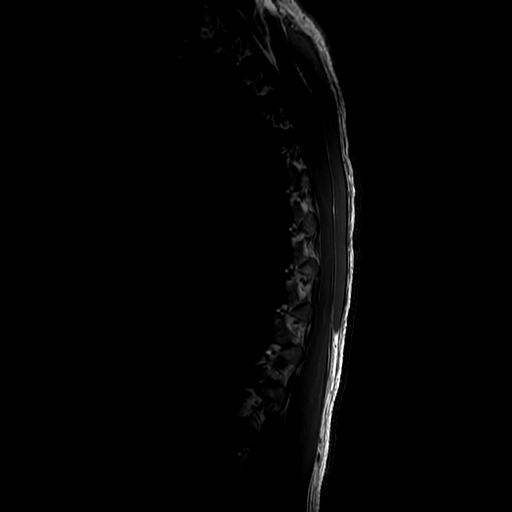

[Series 6: T2 · axial · 4.0mm · 0.39mm/px · z∈[-177,-32]mm · 4 of 29 slices shown (2 of 3)]
[im 1/29]
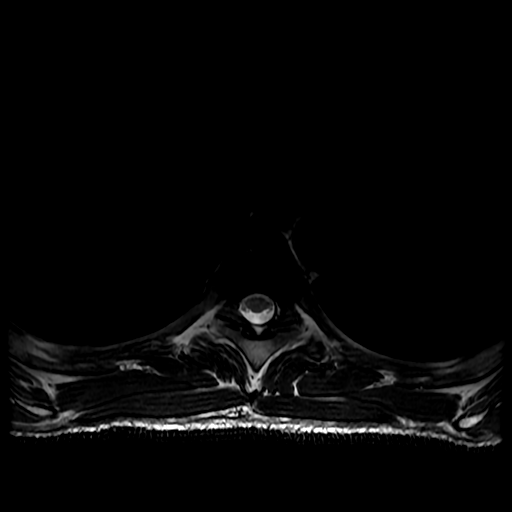
[im 10/29]
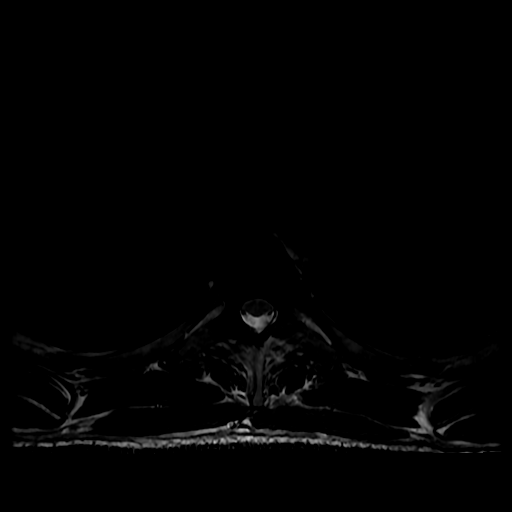
[im 19/29]
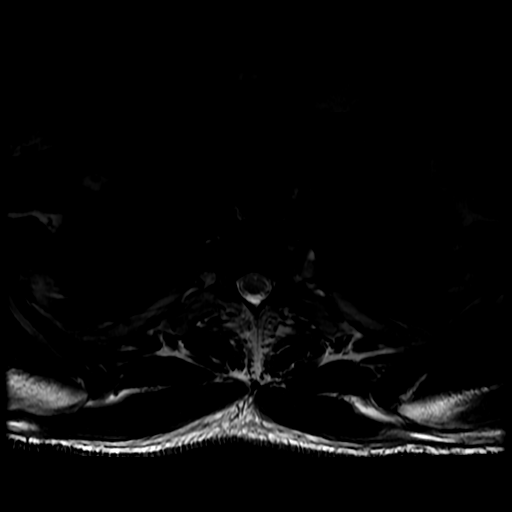
[im 29/29]
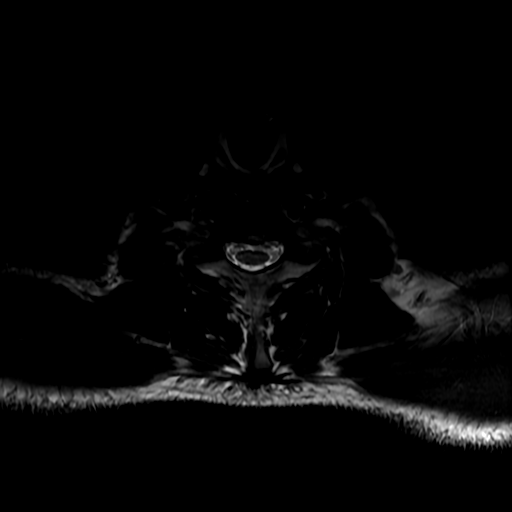

[Series 8: T1 · axial · non-contrast · 4.0mm · 0.39mm/px · z∈[-177,-130]mm · 2 of 29 slices shown (3 of 3)]
[im 1/29]
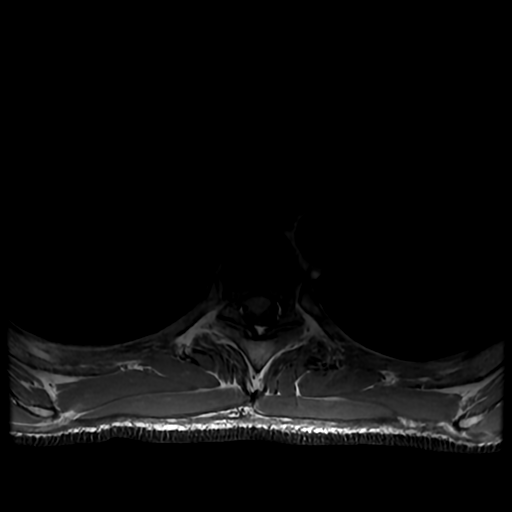
[im 10/29]
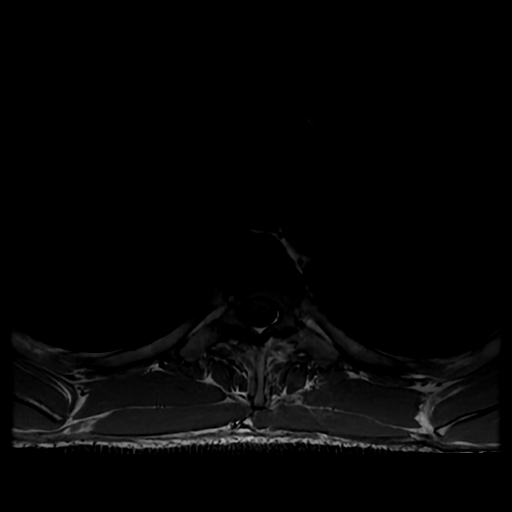

[Series 9: T2 · axial · 4.0mm · 0.39mm/px · z∈[-302,-127]mm · 5 of 33 slices shown (3 of 3)]
[im 1/33]
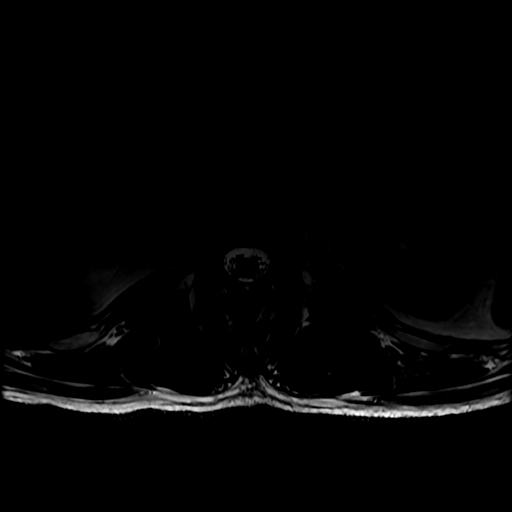
[im 9/33]
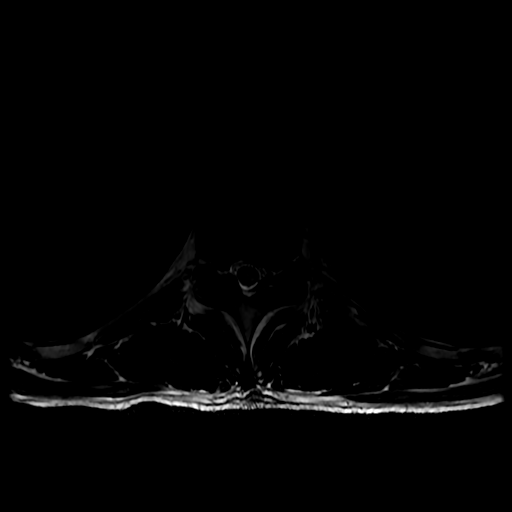
[im 17/33]
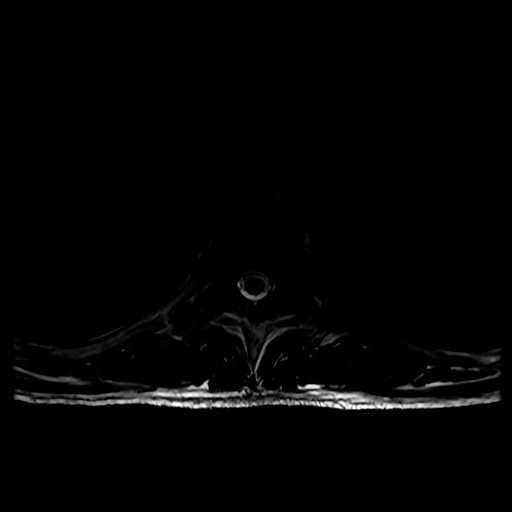
[im 25/33]
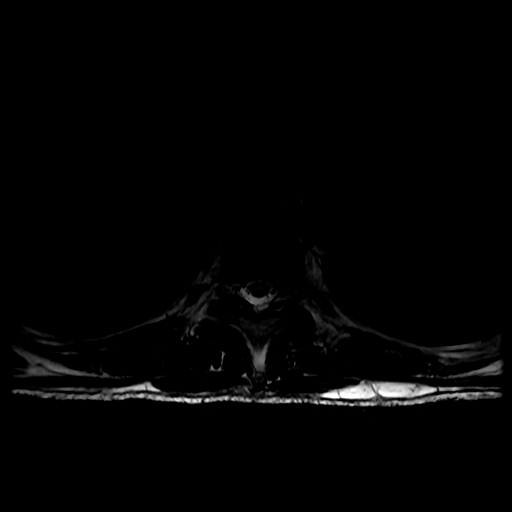
[im 33/33]
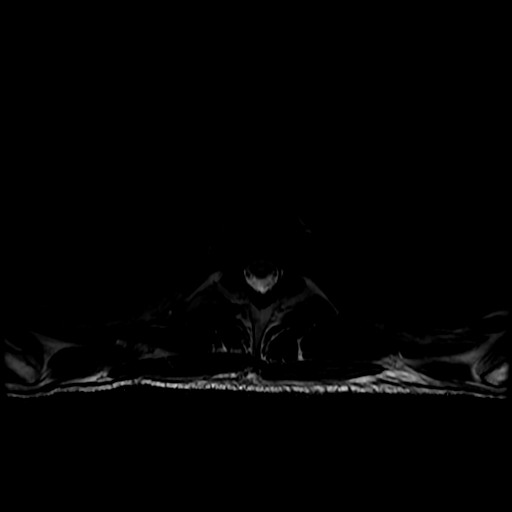

[19 of 48 positions shown; findings below may reference images not displayed]

FINDINGS: MRI THORACIC SPINE FINDINGS

Alignment:  Exaggerated thoracic kyphosis.  Negative for listhesis.

Vertebrae: No fracture, evidence of discitis, or bone lesion.

Cord:  Normal signal and morphology.

Paraspinal and other soft tissues: Negative.

Disc levels:

Small left paracentral protrusion at T11-12. No high-grade stenosis
of the canal or foramina.

MRI LUMBAR SPINE FINDINGS

Segmentation:  5 lumbar type vertebrae

Alignment:  Normal

Vertebrae:  No fracture, evidence of discitis, or bone lesion.

Conus medullaris: Extends to the L1 level and appears normal.

Paraspinal and other soft tissues: Negative for perispinal mass or
inflammation.

Disc levels:

Mild disc desiccation with bulging at L4-5 and L5-S1. Early facet
spurring at the same levels with mild to moderate left foraminal
narrowing.
IMPRESSION: 1. No acute finding. Normal appearance of the thoracic cord and
cauda equina.
2. Overall mild degenerative changes as described.
